# Patient Record
Sex: Male | Born: 1995 | Race: Black or African American | Hispanic: No | Marital: Single | Smoking: Never smoker
Health system: Southern US, Community
[De-identification: ages and names within clinical notes are randomized; demographics above are authoritative.]

## PROBLEM LIST (undated history)

## (undated) DIAGNOSIS — T7840XA Allergy, unspecified, initial encounter: Secondary | ICD-10-CM

## (undated) DIAGNOSIS — F988 Other specified behavioral and emotional disorders with onset usually occurring in childhood and adolescence: Secondary | ICD-10-CM

## (undated) DIAGNOSIS — L309 Dermatitis, unspecified: Secondary | ICD-10-CM

## (undated) DIAGNOSIS — L709 Acne, unspecified: Secondary | ICD-10-CM

## (undated) DIAGNOSIS — J45909 Unspecified asthma, uncomplicated: Secondary | ICD-10-CM

## (undated) DIAGNOSIS — F819 Developmental disorder of scholastic skills, unspecified: Secondary | ICD-10-CM

## (undated) HISTORY — DX: Allergy, unspecified, initial encounter: T78.40XA

## (undated) HISTORY — DX: Other specified behavioral and emotional disorders with onset usually occurring in childhood and adolescence: F98.8

## (undated) HISTORY — DX: Developmental disorder of scholastic skills, unspecified: F81.9

## (undated) HISTORY — DX: Unspecified asthma, uncomplicated: J45.909

## (undated) HISTORY — DX: Acne, unspecified: L70.9

## (undated) HISTORY — DX: Dermatitis, unspecified: L30.9

---
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
## 2016-01-25 ENCOUNTER — Encounter: Payer: Self-pay | Admitting: Family Medicine

## 2016-01-25 ENCOUNTER — Ambulatory Visit (INDEPENDENT_AMBULATORY_CARE_PROVIDER_SITE_OTHER): Payer: Medicaid Other | Admitting: Family Medicine

## 2016-01-25 VITALS — BP 108/72 | HR 60 | Temp 98.6°F | Resp 15 | Ht 67.0 in | Wt 139.5 lb

## 2016-01-25 DIAGNOSIS — Z23 Encounter for immunization: Secondary | ICD-10-CM | POA: Diagnosis not present

## 2016-01-25 DIAGNOSIS — J302 Other seasonal allergic rhinitis: Secondary | ICD-10-CM | POA: Diagnosis not present

## 2016-01-25 DIAGNOSIS — F901 Attention-deficit hyperactivity disorder, predominantly hyperactive type: Secondary | ICD-10-CM

## 2016-01-25 DIAGNOSIS — J4520 Mild intermittent asthma, uncomplicated: Secondary | ICD-10-CM | POA: Diagnosis not present

## 2016-01-25 MED ORDER — LISDEXAMFETAMINE DIMESYLATE 30 MG PO CAPS: 30.0000 mg | ORAL_CAPSULE | Freq: Every day | ORAL | 0 refills | Status: DC

## 2016-01-25 NOTE — Progress Notes (Signed)
Name: SHAH Pitts   MRN: [id number]    DOB: 1995/11/27   Date:01/25/2016       Progress Note  Subjective  Chief Complaint  Chief Complaint  Patient presents with  . Follow-up    2 mo  . 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 He took Vyvanse 30 mg daily for the month of March/April 2017 - his grades improved, he just started [employer] [employer] a few weeks ago, he has been taking Vyvanse and is helping his focus, no grades available yet, he is not feeling overwhelmed with school work. He denies side effects of medication, except for mild lack of appetite at lunch.   AR: he states he is doing well at this, he states symptoms are worse in Spring, currently he has mild sneezing and mild rhinorrhea and nasal congestion with the change in the weather. He has been using nasal steroid and it is helping him  Asthma: he states he has been doing well, no recent episodes of wheezing, cough or SOB, he has not used his rescue inhaler since last Spring. Usually triggered by activity or URI  Patient Active Problem List   Diagnosis Date Noted  . Eczema 02/23/2015  . ADHD (attention deficit hyperactivity disorder) 02/23/2015  . Allergic rhinitis, seasonal 02/23/2015  . Asthma, mild intermittent 02/23/2015  . Learning problem 02/23/2015    History reviewed. No pertinent surgical history.  Family History  Problem Relation Age of Onset  . Asthma Sister   . Seizures Sister     Social History   Social History  . Marital status: Single    Spouse name: N/A  . Number of children: N/A  . Years of education: N/A   Occupational History  . Not on file.   Social History Main Topics  . Smoking status: Never Smoker  . Smokeless tobacco: Never Used  . Alcohol use No  . Drug use: No  . Sexual activity: No   Other Topics Concern  . Not on file   Social History Narrative  . No narrative on file     Current  Outpatient Prescriptions:  .  albuterol (PROVENTIL HFA;VENTOLIN HFA) 108 (90 Base) MCG/ACT inhaler, Inhale 2 puffs into the lungs every 6 (six) hours as needed for wheezing or shortness of breath., Disp: 1 Inhaler, Rfl: 0 .  lisdexamfetamine (VYVANSE) 30 MG capsule, Take 1 capsule (30 mg total) by mouth daily., Disp: 30 capsule, Rfl: 0 .  loratadine (CLARITIN) 10 MG tablet, Take 1 tablet (10 mg total) by mouth daily., Disp: 30 tablet, Rfl: 2 .  lisdexamfetamine (VYVANSE) 30 MG capsule, Take 1 capsule (30 mg total) by mouth daily., Disp: 30 capsule, Rfl: 0 .  lisdexamfetamine (VYVANSE) 30 MG capsule, Take 1 capsule (30 mg total) by mouth daily., Disp: 30 capsule, Rfl: 0  No Known Allergies   ROS  Constitutional: Negative for fever or weight change.  Respiratory: Negative for cough and shortness of breath.   Cardiovascular: Negative for chest pain or palpitations.  Gastrointestinal: Negative for abdominal pain, no bowel changes.  Musculoskeletal: Negative for gait problem or joint swelling.  Skin: Negative for rash.  Neurological: Negative for dizziness or headache.  No other specific complaints in a complete review of systems (except as listed in HPI above).  Objective  Vitals:   01/25/16 0801  BP: 108/72  Pulse: 60  Resp: 15  Temp:  98.6 F (37 C)  TempSrc: Oral  SpO2: 97%  Weight: 139 lb 8 oz (63.3 kg)  Height: 5\' 7"  (1.702 m)    Body mass index is 21.85 kg/m.  Physical Exam  Constitutional: Patient appears well-developed and well-nourished. No distress.  HEENT: head atraumatic, normocephalic, pupils equal and reactive to light, boggy turbinates, neck supple, throat within normal limits Cardiovascular: Normal rate, regular rhythm and normal heart sounds.  No murmur heard. No BLE edema. Pulmonary/Chest: Effort normal and breath sounds normal. No respiratory distress. Abdominal: Soft.  There is no tenderness. Psychiatric: Patient has a normal mood and affect. behavior is  normal. Judgment and thought content normal.  PHQ2/9: [facility] screen [facility] 2/9 01/25/2016 11/24/2015 07/22/2015 02/23/2015  Decreased Interest 0 0 0 0  Down, Depressed, Hopeless 1 0 1 0  PHQ - 2 Score 1 0 1 0    Fall Risk: Fall Risk  01/25/2016 11/24/2015 07/22/2015 02/23/2015  Falls in the past year? No No No No     Functional Status Survey: Is the patient deaf or have difficulty hearing?: No Does the patient have difficulty seeing, even when wearing glasses/contacts?: No Does the patient have difficulty concentrating, remembering, or making decisions?: No Does the patient have difficulty walking or climbing stairs?: No Does the patient have difficulty dressing or bathing?: No Does the patient have difficulty doing errands alone such as visiting a doctor's office or shopping?: No    Assessment & Plan  1. Asthma, mild intermittent, uncomplicated  Doing well at this time  2. Allergic rhinitis, seasonal  Continue medications prn   3. Attention-deficit hyperactivity disorder, predominantly hyperactive type  - lisdexamfetamine (VYVANSE) 30 MG capsule; Take 1 capsule (30 mg total) by mouth daily.  Dispense: 30 capsule; Refill: 0 - lisdexamfetamine (VYVANSE) 30 MG capsule; Take 1 capsule (30 mg total) by mouth daily.  Dispense: 30 capsule; Refill: 0 - lisdexamfetamine (VYVANSE) 30 MG capsule; Take 1 capsule (30 mg total) by mouth daily.  Dispense: 30 capsule; Refill: 0  4. Needs flu shot  - Flu Vaccine QUAD 36+ mos IM

## 2016-03-27 ENCOUNTER — Other Ambulatory Visit: Payer: Self-pay | Admitting: Emergency Medicine

## 2016-03-27 DIAGNOSIS — Z111 Encounter for screening for respiratory tuberculosis: Secondary | ICD-10-CM

## 2016-03-29 LAB — QUANTIFERON TB GOLD ASSAY (BLOOD)
INTERFERON GAMMA RELEASE ASSAY: NEGATIVE
MITOGEN-NIL SO: 4.09 [IU]/mL
QUANTIFERON NIL VALUE: 0.06 [IU]/mL
Quantiferon Tb Ag Minus Nil Value: 0.16 IU/mL

## 2016-05-30 ENCOUNTER — Ambulatory Visit: Payer: Medicaid Other | Admitting: Family Medicine

## 2016-06-23 ENCOUNTER — Ambulatory Visit (INDEPENDENT_AMBULATORY_CARE_PROVIDER_SITE_OTHER): Payer: Medicaid Other | Admitting: Family Medicine

## 2016-06-23 ENCOUNTER — Encounter: Payer: Self-pay | Admitting: Family Medicine

## 2016-06-23 VITALS — BP 100/68 | HR 64 | Temp 98.2°F | Resp 16 | Ht 67.0 in | Wt 136.4 lb

## 2016-06-23 DIAGNOSIS — J302 Other seasonal allergic rhinitis: Secondary | ICD-10-CM

## 2016-06-23 DIAGNOSIS — F901 Attention-deficit hyperactivity disorder, predominantly hyperactive type: Secondary | ICD-10-CM

## 2016-06-23 DIAGNOSIS — J4520 Mild intermittent asthma, uncomplicated: Secondary | ICD-10-CM | POA: Diagnosis not present

## 2016-06-23 DIAGNOSIS — G4489 Other headache syndrome: Secondary | ICD-10-CM | POA: Diagnosis not present

## 2016-06-23 DIAGNOSIS — G4720 Circadian rhythm sleep disorder, unspecified type: Secondary | ICD-10-CM

## 2016-06-23 MED ORDER — LORATADINE 10 MG PO TABS: 10.0000 mg | ORAL_TABLET | Freq: Every day | ORAL | 2 refills | Status: DC

## 2016-06-23 MED ORDER — LISDEXAMFETAMINE DIMESYLATE 30 MG PO CAPS: 30.0000 mg | ORAL_CAPSULE | Freq: Every day | ORAL | 0 refills | Status: DC

## 2016-06-23 MED ORDER — ALBUTEROL SULFATE HFA 108 (90 BASE) MCG/ACT IN AERS: 2.0000 | INHALATION_SPRAY | Freq: Four times a day (QID) | RESPIRATORY_TRACT | 0 refills | Status: DC | PRN

## 2016-06-23 NOTE — Progress Notes (Addendum)
Name: Benjamin Pitts   MRN: [id number]    DOB: 24-Apr-1996   Date:06/23/2016       Progress Note  Subjective  Chief Complaint  Chief Complaint  Patient presents with  . Asthma    4 month follow up  . 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 He took Vyvanse 30 mg daily for the month of March/April 2017 - his grades improved, [facility] [employer] [facility]  he has been taking Vyvanse and is helping him focusHe denies side effects of medication, except for mild lack of appetite at lunch.   AR: he states he is doing well at this, he states symptoms are worse in Spring, currently he has mild sneezing and mild rhinorrhea and nasal congestion with the change in the weather. He has been using nasal steroid and it is helping him  Asthma: he states he has been doing well, no recent episodes of wheezing, cough or SOB, he has not used rescue inhaler recently.   Headache: he states episodes started a couple months ago, it is getting more frequent, he states not present on weekends. Usually during the week when classes are filled with students. Headache is described as dull and sometimes sharp, and resolves when class is over. He has been taking aspirin and tylenol prn. He is taking medication, and explained he needs to stop taking it because it may cause rebound headaches. Advised to sit in the front of the class. Discussed follow up with therapist, since it seems to be related to anxiety  Sleep problems: taking naps and has difficulty staying asleep at night.    Patient Active Problem List   Diagnosis Date Noted  . Eczema 02/23/2015  . ADHD (attention deficit hyperactivity disorder) 02/23/2015  . Allergic rhinitis, seasonal 02/23/2015  . Asthma, mild intermittent 02/23/2015  . Learning problem 02/23/2015    History reviewed. No pertinent surgical history.  Family History  Problem Relation Age of Onset  . Asthma Sister    . Seizures Sister     Social History   Social History  . Marital status: Single    Spouse name: N/A  . Number of children: N/A  . Years of education: N/A   Occupational History  . Not on file.   Social History Main Topics  . Smoking status: Never Smoker  . Smokeless tobacco: Never Used  . Alcohol use No  . Drug use: No  . Sexual activity: No   Other Topics Concern  . Not on file   Social History Narrative   Graduated HS Spring 2017   [facility] [employer] [facility]. 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  albuterol (PROVENTIL HFA;VENTOLIN HFA) 108 (90 Base) MCG/ACT inhaler, Inhale 2 puffs into the lungs every 6 (six) hours as needed for wheezing or shortness of breath., Disp: 1 Inhaler, Rfl: 0 .  lisdexamfetamine (VYVANSE) 30 MG capsule, Take 1 capsule (30 mg total) by mouth daily., Disp: 30 capsule, Rfl: 0 .  lisdexamfetamine (VYVANSE) 30 MG capsule, Take 1 capsule (30 mg total) by mouth daily., Disp: 30 capsule, Rfl: 0 .  lisdexamfetamine (VYVANSE) 30 MG capsule, Take 1 capsule (30 mg total) by mouth daily., Disp: 30 capsule, Rfl: 0 .  loratadine (CLARITIN) 10 MG tablet, Take 1 tablet (10 mg total) by mouth daily., Disp: 30 tablet, Rfl: 2  No  Known Allergies   ROS  Constitutional: Negative for fever or weight change.  Respiratory: Negative for cough and shortness of breath.   Cardiovascular: Negative for chest pain or palpitations.  Gastrointestinal: Negative for abdominal pain, no bowel changes.  Musculoskeletal: Negative for gait problem or joint swelling.  Skin: Negative for rash.  Neurological: Negative for dizziness , positive for headache.  No other specific complaints in a complete review of systems (except as listed in HPI above).  Objective  Vitals:   06/23/16 0937  BP: 100/68  Pulse: 64  Resp: 16  Temp: 98.2 F (36.8 C)  SpO2: 98%  Weight: 136 lb 7 oz (61.9 kg)  Height: 5\' 7"  (1.702 m)    Body mass index is  21.37 kg/m.  Physical Exam  Constitutional: Patient appears well-developed and well-nourished. No distress.  HEENT: head atraumatic, normocephalic, pupils equal and reactive to light,  neck supple, throat within normal limits Cardiovascular: Normal rate, regular rhythm and normal heart sounds.  No murmur heard. No BLE edema. Pulmonary/Chest: Effort normal and breath sounds normal. No respiratory distress. Abdominal: Soft.  There is no tenderness. Psychiatric: Patient has a normal mood and affect. behavior is normal. Judgment and thought content normal.  Recent Results (from the past 2160 hour(s))  Quantiferon tb gold assay     Status: None   Collection Time: 03/27/16  4:13 PM  Result Value Ref Range   Interferon Gamma Release Assay NEGATIVE NEGATIVE    Comment: Negative test result. M. tuberculosis complex infection unlikely.   Quantiferon Nil Value 0.06 IU/mL   Mitogen-Nil 4.09 IU/mL   Quantiferon Tb Ag Minus Nil Value 0.16 IU/mL    Comment:   The Nil tube value is used to determine if the patient has a preexisting immune response which could cause a false-positive reading on the test. In order for a test to be valid, the Nil tube must have a value of less than or equal to 8.0 IU/mL.   The mitogen control tube is used to assure the patient has a healthy immune status and also serves as a control for correct blood handling and incubation. It is used to detect false-negative readings. The mitogen tube must have a gamma interferon value of greater than or equal to 0.5 IU/mL higher than the value of the Nil tube.   The TB antigen tube is coated with the M. tuberculosis specific antigens. For a test to be considered positive, the TB antigen tube value minus the Nil tube value must be greater than or equal to 0.35 IU/mL.   For additional information, please refer to http://education.questdiagn[facility].com/faq/QFT (This link is being provided for informational/educational purposes  only.)       PHQ2/9: Depression screen [facility] 2/9 01/25/2016 11/24/2015 07/22/2015 02/23/2015  Decreased Interest 0 0 0 0  Down, Depressed, Hopeless 1 0 1 0  PHQ - 2 Score 1 0 1 0     Fall Risk: Fall Risk  01/25/2016 11/24/2015 07/22/2015 02/23/2015  Falls in the past year? No No No No     Assessment & Plan  1. Attention-deficit hyperactivity disorder, predominantly hyperactive type  - lisdexamfetamine (VYVANSE) 30 MG capsule; Take 1 capsule (30 mg total) by mouth daily.  Dispense: 30 capsule; Refill: 0 - lisdexamfetamine (VYVANSE) 30 MG capsule; Take 1 capsule (30 mg total) by mouth daily.  Dispense: 30 capsule; Refill: 0 - lisdexamfetamine (VYVANSE) 30 MG capsule; Take 1 capsule (30 mg total) by mouth daily.  Dispense: 30 capsule; Refill: 0  2.  Mild intermittent asthma without complication  - albuterol (PROVENTIL HFA;VENTOLIN HFA) 108 (90 Base) MCG/ACT inhaler; Inhale 2 puffs into the lungs every 6 (six) hours as needed for wheezing or shortness of breath.  Dispense: 1 Inhaler; Refill: 0  3. Seasonal allergic rhinitis, unspecified chronicity, unspecified trigger  - loratadine (CLARITIN) 10 MG tablet; Take 1 tablet (10 mg total) by mouth daily.  Dispense: 30 tablet; Refill: 2  4. Circadian dysregulation  Discussed sleep hygiene , stop naps in the pm, wake up at the same time every morning  5. Other headache syndrome  Likely from anxiety, going to see therapist today, advised to try sitting in the front of the class

## 2016-09-20 ENCOUNTER — Ambulatory Visit: Payer: Medicaid Other | Admitting: Family Medicine

## 2016-11-28 ENCOUNTER — Encounter: Payer: Self-pay | Admitting: Family Medicine

## 2016-11-28 ENCOUNTER — Ambulatory Visit (INDEPENDENT_AMBULATORY_CARE_PROVIDER_SITE_OTHER): Payer: Medicaid Other | Admitting: Family Medicine

## 2016-11-28 VITALS — BP 108/68 | HR 76 | Temp 98.2°F | Resp 16 | Ht 67.0 in | Wt 136.0 lb

## 2016-11-28 DIAGNOSIS — J4540 Moderate persistent asthma, uncomplicated: Secondary | ICD-10-CM

## 2016-11-28 DIAGNOSIS — J302 Other seasonal allergic rhinitis: Secondary | ICD-10-CM | POA: Diagnosis not present

## 2016-11-28 DIAGNOSIS — F901 Attention-deficit hyperactivity disorder, predominantly hyperactive type: Secondary | ICD-10-CM | POA: Diagnosis not present

## 2016-11-28 MED ORDER — LISDEXAMFETAMINE DIMESYLATE 30 MG PO CAPS: 30.0000 mg | ORAL_CAPSULE | Freq: Every day | ORAL | 0 refills | Status: DC

## 2016-11-28 MED ORDER — BECLOMETHASONE DIPROPIONATE 40 MCG/ACT IN AERS: 2.0000 | INHALATION_SPRAY | Freq: Two times a day (BID) | RESPIRATORY_TRACT | 2 refills | Status: DC

## 2016-11-28 NOTE — Addendum Note (Signed)
Addended by: Cynda FamiliaJOHNSON, Chasyn Cinque L on: 11/28/2016 03:46 PM   Modules accepted: Orders

## 2016-11-28 NOTE — Progress Notes (Signed)
Name: Benjamin Pitts   MRN: [id number]    DOB: 1995/08/26   Date:11/28/2016       Progress Note  Subjective  Chief Complaint  Chief Complaint  Patient presents with  . Medication Refill    4 month F/U  . Asthma    Well controlled  . ADHD    Needs refill-for working and going to school  . Allergic Rhinitis     Nose Stops up occasionally    HPI  ADHD with learning disability: he states He [facility] [employer] 2017  he has been tout of Vyvanse and is back to school, he has noticed difficulty with focus, and staying organized. He usually skips medication, takes mostly for test days, but advised to take it daily while in college. No side effects  AR: he still has episodes of nasal congestion and sneezing.   Asthma: he had a few episodes of wheezing and cough over the past couple of months, usually triggered by exposure to dust or exercise, and seems to be worse at night, spirometry was not normal, not sure if effort based, but because symptoms more often we will add Qvar   Patient Active Problem List   Diagnosis Date Noted  . Eczema 02/23/2015  . ADHD (attention deficit hyperactivity disorder) 02/23/2015  . Allergic rhinitis, seasonal 02/23/2015  . Asthma, mild intermittent 02/23/2015  . Learning problem 02/23/2015    History reviewed. No pertinent surgical history.  Family History  Problem Relation Age of Onset  . Asthma Sister   . Seizures Sister     Social History   Social History  . Marital status: Single    Spouse name: N/A  . Number of children: N/A  . Years of education: N/A   Occupational History  . Not on file.   Social History Main Topics  . Smoking status: Never Smoker  . Smokeless tobacco: Never Used  . Alcohol use No  . Drug use: No  . Sexual activity: No   Other Topics Concern  . Not on file   Social History Narrative   Graduated HS Spring 2017   Going [facility] [employer] with scholarship.   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  albuterol (PROVENTIL HFA;VENTOLIN HFA) 108 (90 Base) MCG/ACT inhaler, Inhale 2 puffs into the lungs every 6 (six) hours as needed for wheezing or shortness of breath., Disp: 1 Inhaler, Rfl: 0 .  lisdexamfetamine (VYVANSE) 30 MG capsule, Take 1 capsule (30 mg total) by mouth daily., Disp: 30 capsule, Rfl: 0 .  lisdexamfetamine (VYVANSE) 30 MG capsule, Take 1 capsule (30 mg total) by mouth daily., Disp: 30 capsule, Rfl: 0 .  lisdexamfetamine (VYVANSE) 30 MG capsule, Take 1 capsule (30 mg total) by mouth daily., Disp: 30 capsule, Rfl: 0 .  loratadine (CLARITIN) 10 MG tablet, Take 1 tablet (10 mg total) by mouth daily., Disp: 30 tablet, Rfl: 2 .  beclomethasone (QVAR) 40 MCG/ACT inhaler, Inhale 2 puffs into the lungs 2 (two) times daily., Disp: 1 Inhaler, Rfl: 2  No Known Allergies   ROS  Constitutional: Negative for fever or weight change.  Respiratory: Positive for cough and shortness of breath.   Cardiovascular: Negative for chest pain or palpitations.  Gastrointestinal: Negative for abdominal pain, no bowel changes.  Musculoskeletal: Negative for gait problem or joint swelling.  Skin: Negative for rash.  Neurological: Negative for dizziness or headache.  No other specific complaints in a complete review of systems (except as listed in HPI above).  Objective  Vitals:   11/28/16 1521  BP: 108/68  Pulse: 76  Resp: 16  Temp: 98.2 F (36.8 C)  TempSrc: Oral  SpO2: 96%  Weight: 136 lb (61.7 kg)  Height: 5\' 7"  (1.702 m)    Body mass index is 21.3 kg/m.  Physical Exam  Constitutional: Patient appears well-developed and well-nourished. No distress.  HEENT: head atraumatic, normocephalic, pupils equal and reactive to light, neck supple, throat within normal limits Cardiovascular: Normal rate, regular rhythm and normal heart sounds.  No murmur heard. No BLE edema. Pulmonary/Chest: Effort normal and breath sounds normal. No  respiratory distress. Abdominal: Soft.  There is no tenderness. Psychiatric: Patient has a normal mood and affect. behavior is normal. Judgment and thought content normal.  PHQ2/9: Depression screen [facility] 2/9 11/28/2016 01/25/2016 11/24/2015 07/22/2015 02/23/2015  Decreased Interest 0 0 0 0 0  Down, Depressed, Hopeless 0 1 0 1 0  PHQ - 2 Score 0 1 0 1 0     Fall Risk: Fall Risk  11/28/2016 01/25/2016 11/24/2015 07/22/2015 02/23/2015  Falls in the past year? No No No No No     Functional Status Survey: Is the patient deaf or have difficulty hearing?: No Does the patient have difficulty seeing, even when wearing glasses/contacts?: No Does the patient have difficulty concentrating, remembering, or making decisions?: No Does the patient have difficulty walking or climbing stairs?: No Does the patient have difficulty dressing or bathing?: No Does the patient have difficulty doing errands alone such as visiting a doctor's office or shopping?: No    Assessment & Plan  1. Moderate persistent asthma without complication  - beclomethasone (QVAR) 40 MCG/ACT inhaler; Inhale 2 puffs into the lungs 2 (two) times daily.  Dispense: 1 Inhaler; Refill: 2  - spirometry   2. Attention-deficit hyperactivity disorder, predominantly hyperactive type  - lisdexamfetamine (VYVANSE) 30 MG capsule; Take 1 capsule (30 mg total) by mouth daily.  Dispense: 30 capsule; Refill: 0 - lisdexamfetamine (VYVANSE) 30 MG capsule; Take 1 capsule (30 mg total) by mouth daily.  Dispense: 30 capsule; Refill: 0 - lisdexamfetamine (VYVANSE) 30 MG capsule; Take 1 capsule (30 mg total) by mouth daily.  Dispense: 30 capsule; Refill: 0  3. Seasonal allergic rhinitis, unspecified trigger  Continue medication

## 2017-03-02 ENCOUNTER — Encounter: Payer: Self-pay | Admitting: Family Medicine

## 2017-03-02 ENCOUNTER — Ambulatory Visit (INDEPENDENT_AMBULATORY_CARE_PROVIDER_SITE_OTHER): Payer: Medicaid Other | Admitting: Family Medicine

## 2017-03-02 VITALS — BP 100/50 | HR 63 | Resp 12 | Ht 67.0 in | Wt 136.5 lb

## 2017-03-02 DIAGNOSIS — J302 Other seasonal allergic rhinitis: Secondary | ICD-10-CM

## 2017-03-02 DIAGNOSIS — Z23 Encounter for immunization: Secondary | ICD-10-CM | POA: Diagnosis not present

## 2017-03-02 DIAGNOSIS — J4540 Moderate persistent asthma, uncomplicated: Secondary | ICD-10-CM

## 2017-03-02 DIAGNOSIS — F901 Attention-deficit hyperactivity disorder, predominantly hyperactive type: Secondary | ICD-10-CM | POA: Diagnosis not present

## 2017-03-02 MED ORDER — LISDEXAMFETAMINE DIMESYLATE 30 MG PO CAPS: 30.0000 mg | ORAL_CAPSULE | Freq: Every day | ORAL | 0 refills | Status: DC

## 2017-03-02 MED ORDER — ALBUTEROL SULFATE HFA 108 (90 BASE) MCG/ACT IN AERS: 2.0000 | INHALATION_SPRAY | Freq: Four times a day (QID) | RESPIRATORY_TRACT | 0 refills | Status: DC | PRN

## 2017-03-02 MED ORDER — LORATADINE 10 MG PO TABS: 10.0000 mg | ORAL_TABLET | Freq: Every day | ORAL | 2 refills | Status: DC

## 2017-03-02 MED ORDER — BECLOMETHASONE DIPROPIONATE 40 MCG/ACT IN AERS: 2.0000 | INHALATION_SPRAY | Freq: Two times a day (BID) | RESPIRATORY_TRACT | 2 refills | Status: DC

## 2017-03-02 NOTE — Progress Notes (Signed)
Name: Benjamin Pitts   MRN: [id number]    DOB: 12/02/1995   Date:03/02/2017       Progress Note  Subjective  Chief Complaint  Chief Complaint  Patient presents with  . Asthma    HPI   ADHD with learning disability: he states He [facility] [employer] [facility] [facility]. 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   AR: he still has episodes of nasal congestion and sneezing but occasionally.   Asthma: he states he is doing better, no recent episodes of wheezing or cough, denies chest pain. He is still not using Qvar daily. Uses rescue inhaler when physically active.   Patient Active Problem List   Diagnosis Date Noted  . Eczema 02/23/2015  . ADHD (attention deficit hyperactivity disorder) 02/23/2015  . Allergic rhinitis, seasonal 02/23/2015  . Asthma, mild intermittent 02/23/2015  . Learning problem 02/23/2015    No past surgical history on file.  Family History  Problem Relation Age of Onset  . Asthma Sister   . Seizures Sister     Social History   Social History  . Marital status: Single    Spouse name: N/A  . Number of children: N/A  . Years of education: N/A   Occupational History  . Not on file.   Social History Main Topics  . Smoking status: Never Smoker  . Smokeless tobacco: Never Used  . Alcohol use No  . Drug use: No  . Sexual activity: No   Other Topics Concern  . Not on file   Social History Narrative   Graduated HS Spring 2017   [facility] [employer] [facility].   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  lisdexamfetamine (VYVANSE) 30 MG capsule, Take 1 capsule (30 mg total) by mouth daily., Disp: 30 capsule, Rfl: 0 .  lisdexamfetamine (VYVANSE) 30 MG capsule, Take 1 capsule (30 mg total) by  mouth daily., Disp: 30 capsule, Rfl: 0 .  lisdexamfetamine (VYVANSE) 30 MG capsule, Take 1 capsule (30 mg total) by mouth daily., Disp: 30 capsule, Rfl: 0 .  albuterol (PROVENTIL HFA;VENTOLIN HFA) 108 (90 Base) MCG/ACT inhaler, Inhale 2 puffs into the lungs every 6 (six) hours as needed for wheezing or shortness of breath., Disp: 1 Inhaler, Rfl: 0 .  beclomethasone (QVAR) 40 MCG/ACT inhaler, Inhale 2 puffs into the lungs 2 (two) times daily., Disp: 1 Inhaler, Rfl: 2 .  loratadine (CLARITIN) 10 MG tablet, Take 1 tablet (10 mg total) by mouth daily., Disp: 30 tablet, Rfl: 2  No Known Allergies   ROS  Constitutional: Negative for fever or weight change.  Respiratory: Negative for cough , occasional shortness of breath.   Cardiovascular: Negative for chest pain or palpitations.  Gastrointestinal: Negative for abdominal pain, no bowel changes.  Musculoskeletal: Negative for gait problem or joint swelling.  Skin: Negative for rash.  Neurological: Negative for dizziness or headache.  No other specific complaints in a complete review of systems (except as listed in HPI above).  Objective  Vitals:   03/02/17 1442  BP: (!) 100/50  Pulse: 63  Resp: 12  SpO2: 98%  Weight: 136 lb 8 oz (61.9 kg)  Height: 5\' 7"  (  1.702 m)    Body mass index is 21.38 kg/m.  Physical Exam  Constitutional: Patient appears well-developed and well-nourished. No distress.  HEENT: head atraumatic, normocephalic, pupils equal and reactive to light, neck supple, throat within normal limits Cardiovascular: Normal rate, regular rhythm and normal heart sounds.  No murmur heard. No BLE edema. Pulmonary/Chest: Effort normal and breath sounds normal. No respiratory distress. Abdominal: Soft.  There is no tenderness. Psychiatric: Patient has a normal mood and affect. behavior is normal. Judgment and thought content normal.  PHQ2/9: Depression screen [facility] 2/9 11/28/2016 01/25/2016 11/24/2015 07/22/2015 02/23/2015  Decreased  Interest 0 0 0 0 0  Down, Depressed, Hopeless 0 1 0 1 0  PHQ - 2 Score 0 1 0 1 0     Fall Risk: Fall Risk  11/28/2016 01/25/2016 11/24/2015 07/22/2015 02/23/2015  Falls in the past year? No No No No No     Assessment & Plan  1. Moderate persistent asthma without complication  - albuterol (PROVENTIL HFA;VENTOLIN HFA) 108 (90 Base) MCG/ACT inhaler; Inhale 2 puffs into the lungs every 6 (six) hours as needed for wheezing or shortness of breath.  Dispense: 1 Inhaler; Refill: 0 - beclomethasone (QVAR) 40 MCG/ACT inhaler; Inhale 2 puffs into the lungs 2 (two) times daily.  Dispense: 1 Inhaler; Refill: 2  2. Attention-deficit hyperactivity disorder, predominantly hyperactive type  - lisdexamfetamine (VYVANSE) 30 MG capsule; Take 1 capsule (30 mg total) by mouth daily.  Dispense: 30 capsule; Refill: 0 - lisdexamfetamine (VYVANSE) 30 MG capsule; Take 1 capsule (30 mg total) by mouth daily.  Dispense: 30 capsule; Refill: 0 - lisdexamfetamine (VYVANSE) 30 MG capsule; Take 1 capsule (30 mg total) by mouth daily.  Dispense: 30 capsule; Refill: 0  Discussed controlled medication, cannot sale, diverge, and cannot take any illegal drugs, he agrees on having urine screen shot as needed.   3. Seasonal allergic rhinitis, unspecified trigger  - loratadine (CLARITIN) 10 MG tablet; Take 1 tablet (10 mg total) by mouth daily.  Dispense: 30 tablet; Refill: 2  4. Needs flu shot  - Flu Vaccine QUAD 36+ mos IM

## 2017-05-30 ENCOUNTER — Encounter: Payer: Self-pay | Admitting: Family Medicine

## 2017-05-30 ENCOUNTER — Ambulatory Visit (INDEPENDENT_AMBULATORY_CARE_PROVIDER_SITE_OTHER): Payer: Medicaid Other | Admitting: Family Medicine

## 2017-05-30 VITALS — BP 100/70 | HR 65 | Temp 98.3°F | Resp 18 | Ht 67.0 in | Wt 137.2 lb

## 2017-05-30 DIAGNOSIS — S83412A Sprain of medial collateral ligament of left knee, initial encounter: Secondary | ICD-10-CM | POA: Diagnosis not present

## 2017-05-30 DIAGNOSIS — S8992XA Unspecified injury of left lower leg, initial encounter: Secondary | ICD-10-CM | POA: Diagnosis not present

## 2017-05-30 NOTE — Progress Notes (Signed)
Name: Benjamin Pitts   MRN: [id number]    DOB: [birth year]   Date:05/30/2017       Progress Note  Subjective  Chief Complaint  Chief Complaint  Patient presents with  . Knee Pain    left knee pain for 4 days    HPI  Pt presents with 4 day history of LEFT knee pain after playing basketball with his cousins.   He went to block someone from going around him and his knee inverted and he fell.  He has been wearing a knee brace to try to help with the pain - has helped a little bit.  He works at [employer] and he was able to do some lighter duties, and did well with this; he has the next couple of days off to rest.  Denies swelling, bruising, leg has not given out on him; no numbness or tingling.  Patient Active Problem List   Diagnosis Date Noted  . Eczema 02/23/2015  . ADHD (attention deficit hyperactivity disorder) 02/23/2015  . Allergic rhinitis, seasonal 02/23/2015  . Asthma, mild intermittent 02/23/2015  . Learning problem 02/23/2015    Social History   Tobacco Use  . Smoking status: Never Smoker  . Smokeless tobacco: Never Used  Substance Use Topics  . Alcohol use: No    Alcohol/week: 0.0 oz     Current Outpatient Medications:  .  albuterol (PROVENTIL HFA;VENTOLIN HFA) 108 (90 Base) MCG/ACT inhaler, Inhale 2 puffs into the lungs every 6 (six) hours as needed for wheezing or shortness of breath., Disp: 1 Inhaler, Rfl: 0 .  beclomethasone (QVAR) 40 MCG/ACT inhaler, Inhale 2 puffs into the lungs 2 (two) times daily., Disp: 1 Inhaler, Rfl: 2 .  lisdexamfetamine (VYVANSE) 30 MG capsule, Take 1 capsule (30 mg total) by mouth daily., Disp: 30 capsule, Rfl: 0 .  lisdexamfetamine (VYVANSE) 30 MG capsule, Take 1 capsule (30 mg total) by mouth daily., Disp: 30 capsule, Rfl: 0 .  lisdexamfetamine (VYVANSE) 30 MG capsule, Take 1 capsule (30 mg total) by mouth daily., Disp: 30 capsule, Rfl: 0 .  loratadine (CLARITIN) 10 MG tablet, Take 1 tablet (10 mg total) by mouth daily.,  Disp: 30 tablet, Rfl: 2  No Known Allergies  ROS  Ten systems reviewed and is negative except as mentioned in HPI  Objective  Vitals:   05/30/17 0844  BP: 100/70  Pulse: 65  Resp: 18  Temp: 98.3 F (36.8 C)  TempSrc: Oral  SpO2: 99%  Weight: 137 lb 3.2 oz (62.2 kg)  Height: 5\' 7"  (1.702 m)   Body mass index is 21.49 kg/m.  Nursing Note and Vital Signs reviewed.  Physical Exam Constitutional: Patient appears well-developed and well-nourished. No distress.  HEENT: head atraumatic, normocephalic Cardiovascular: Normal rate, regular rhythm, S1/S2 present.  No murmur or rub heard. No BLE edema. Pulmonary/Chest: Effort normal and breath sounds clear. No respiratory distress or retractions. Psychiatric: Patient has a normal mood and affect. behavior is normal. Judgment and thought content normal. Musculoskeletal: No joint swelling.  LEFT knee exhibits tenderness to the medial knee, limited flexion, and pain is elicited with inversion and eversion against resistance.  Strength is also decreased at the joint.  Normal popliteal fossa with +2 pulse, no ecchymosis. Skin: Skin is warm and dry. No rash noted. No erythema.   No results found for this or any previous visit (from the past 72 hour(s)).  Assessment & Plan  1. Left knee injury, initial encounter - Ambulatory referral to Orthopedic Surgery -  Appointment at 10am today - pt to go directly there.

## 2017-06-07 ENCOUNTER — Encounter: Payer: Self-pay | Admitting: Family Medicine

## 2017-06-07 ENCOUNTER — Ambulatory Visit (INDEPENDENT_AMBULATORY_CARE_PROVIDER_SITE_OTHER): Payer: Medicaid Other | Admitting: Family Medicine

## 2017-06-07 VITALS — BP 94/60 | HR 56 | Resp 14 | Ht 67.0 in | Wt 140.0 lb

## 2017-06-07 DIAGNOSIS — J4540 Moderate persistent asthma, uncomplicated: Secondary | ICD-10-CM

## 2017-06-07 DIAGNOSIS — S83402D Sprain of unspecified collateral ligament of left knee, subsequent encounter: Secondary | ICD-10-CM

## 2017-06-07 DIAGNOSIS — F901 Attention-deficit hyperactivity disorder, predominantly hyperactive type: Secondary | ICD-10-CM | POA: Diagnosis not present

## 2017-06-07 DIAGNOSIS — J302 Other seasonal allergic rhinitis: Secondary | ICD-10-CM | POA: Diagnosis not present

## 2017-06-07 MED ORDER — LISDEXAMFETAMINE DIMESYLATE 30 MG PO CAPS: 30.0000 mg | ORAL_CAPSULE | Freq: Every day | ORAL | 0 refills | Status: DC

## 2017-06-07 MED ORDER — ALBUTEROL SULFATE HFA 108 (90 BASE) MCG/ACT IN AERS: 2.0000 | INHALATION_SPRAY | Freq: Four times a day (QID) | RESPIRATORY_TRACT | 0 refills | Status: DC | PRN

## 2017-06-07 MED ORDER — BECLOMETHASONE DIPROPIONATE 40 MCG/ACT IN AERS: 2.0000 | INHALATION_SPRAY | Freq: Two times a day (BID) | RESPIRATORY_TRACT | 2 refills | Status: DC

## 2017-06-07 NOTE — Progress Notes (Signed)
Name: Benjamin BolusMonquaille R Pitts   MRN: [id number]    DOB: July 26, 1995   Date:06/07/2017       Progress Note  Subjective  Chief Complaint  Chief Complaint  Patient presents with  . ADD  . Asthma    HPI   ADHD with learning disability: he states He started [employer] 2017. He is not currently in school trying to go back, and is working as a host at [employer], he has a Insurance account managerjob coach and I applying for colleges and is spending a long time doing research and has to take Vyvanse to be able to function and stay focused. No side effects of medication. Denies change in appetite or palpitation    AR: he still has episodes of nasal congestion and sneezing but occasionally.   Asthma: he states he is doing better, no recent episodes of wheezing or cough, denies chest pain. He is now using Qvar daily but told me today he uses Ventolin a couple of times a day, usually when feels overwhelmed at work - explained rescue inhaler is for bronchospasms and not anxiety sensation. Needs to take deep breathes and only use Ventolin prn for asthma  Sprain of left collateral medial ligament : after an injury earlier this month, seen by Dr. Odis LusterBowers, negative x-ray, wearing a brace and also taking Meloxicam, states feeling better, having modified duties at work and church  Patient Active Problem List   Diagnosis Date Noted  . Eczema 02/23/2015  . ADHD (attention deficit hyperactivity disorder) 02/23/2015  . Allergic rhinitis, seasonal 02/23/2015  . Asthma, mild intermittent 02/23/2015  . Learning problem 02/23/2015    History reviewed. No pertinent surgical history.  Family History  Problem Relation Age of Onset  . Asthma Sister   . Seizures Sister     Social History   Socioeconomic History  . Marital status: Single    Spouse name: Not on file  . Number of children: Not on file  . Years of education: Not on file  . Highest education level: Not on file  Social Needs  . Financial resource strain: Not on file  . Food  insecurity - worry: Not on file  . Food insecurity - inability: Not on file  . Transportation needs - medical: Not on file  . Transportation needs - non-medical: Not on file  Occupational History  . Not on file  Tobacco Use  . Smoking status: Never Smoker  . Smokeless tobacco: Never Used  Substance and Sexual Activity  . Alcohol use: No    Alcohol/week: 0.0 oz  . Drug use: No  . Sexual activity: No  Other Topics Concern  . Not on file  Social History Narrative   Graduated HS Spring 2017   Going to [employer] with scholarship.   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  albuterol (PROVENTIL HFA;VENTOLIN HFA) 108 (90 Base) MCG/ACT inhaler, Inhale 2 puffs into the lungs every 6 (six) hours as needed for wheezing or shortness of breath., Disp: 1 Inhaler, Rfl: 0 .  beclomethasone (QVAR) 40 MCG/ACT inhaler, Inhale 2 puffs into the lungs 2 (two) times daily., Disp: 1 Inhaler, Rfl: 2 .  lisdexamfetamine (VYVANSE) 30 MG capsule, Take 1 capsule (30 mg total) by mouth daily., Disp: 30 capsule, Rfl: 0 .  lisdexamfetamine (VYVANSE) 30 MG capsule, Take 1 capsule (30 mg total) by mouth daily., Disp: 30 capsule, Rfl: 0 .  lisdexamfetamine (VYVANSE) 30 MG capsule, Take 1 capsule (30 mg total) by mouth daily., Disp: 30 capsule, Rfl: 0 .  loratadine (CLARITIN) 10 MG tablet, Take 1 tablet (10 mg total) by mouth daily., Disp: 30 tablet, Rfl: 2  No Known Allergies   ROS  Constitutional: Negative for fever or weight change.  Respiratory: Negative for cough and shortness of breath.   Cardiovascular: Negative for chest pain or palpitations.  Gastrointestinal: Negative for abdominal pain, no bowel changes.  Musculoskeletal: Negative for gait problem or joint swelling.  Skin: Negative for rash.  Neurological: Negative for dizziness or headache.  No other specific complaints in a complete review of systems (except as listed in HPI  above).  Objective  Vitals:   06/07/17 0946  BP: 94/60  Pulse: (!) 56  Resp: 14  SpO2: 98%  Weight: 140 lb (63.5 kg)  Height: 5\' 7"  (1.702 m)    Body mass index is 21.93 kg/m.  Physical Exam  Constitutional: Patient appears well-developed and well-nourished. No distress.  HEENT: head atraumatic, normocephalic, pupils equal and reactive to light,neck supple, throat within normal limits Cardiovascular: Normal rate, regular rhythm and normal heart sounds.  No murmur heard. No BLE edema. Pulmonary/Chest: Effort normal and breath sounds normal. No respiratory distress. Abdominal: Soft.  There is no tenderness. Psychiatric: Patient has a normal mood and affect. behavior is normal. Judgment and thought content normal. Muscular Skeletal: not done, wearing a hinge brace left knee   PHQ2/9: Depression screen [facility] 2/9 11/28/2016 01/25/2016 11/24/2015 07/22/2015 02/23/2015  Decreased Interest 0 0 0 0 0  Down, Depressed, Hopeless 0 1 0 1 0  PHQ - 2 Score 0 1 0 1 0     Fall Risk: Fall Risk  06/07/2017 11/28/2016 01/25/2016 11/24/2015 07/22/2015  Falls in the past year? Yes No No No No  Number falls in past yr: 1 - - - -  Injury with Fall? Yes - - - -  Follow up Education provided - - - -     Functional Status Survey: Is the patient deaf or have difficulty hearing?: No Does the patient have difficulty seeing, even when wearing glasses/contacts?: No Does the patient have difficulty concentrating, remembering, or making decisions?: No Does the patient have difficulty walking or climbing stairs?: No Does the patient have difficulty dressing or bathing?: No Does the patient have difficulty doing errands alone such as visiting a doctor's office or shopping?: No    Assessment & Plan  1. Moderate persistent asthma without complication  - beclomethasone (QVAR) 40 MCG/ACT inhaler; Inhale 2 puffs into the lungs 2 (two) times daily.  Dispense: 1 Inhaler; Refill: 2 - albuterol (PROVENTIL  HFA;VENTOLIN HFA) 108 (90 Base) MCG/ACT inhaler; Inhale 2 puffs into the lungs every 6 (six) hours as needed for wheezing or shortness of breath.  Dispense: 1 Inhaler; Refill: 0  2. Attention-deficit hyperactivity disorder, predominantly hyperactive type  - lisdexamfetamine (VYVANSE) 30 MG capsule; Take 1 capsule (30 mg total) by mouth daily.  Dispense: 30 capsule; Refill: 0 - lisdexamfetamine (VYVANSE) 30 MG capsule; Take 1 capsule (30 mg total) by mouth daily.  Dispense: 30 capsule; Refill: 0 - lisdexamfetamine (VYVANSE) 30 MG capsule; Take 1 capsule (30 mg total) by mouth daily.  Dispense: 30 capsule; Refill: 0  3. Seasonal allergic rhinitis, unspecified trigger   4. Sprain of collateral ligament of left knee, subsequent encounter  Continue follow up with Dr. Odis Luster

## 2017-06-07 NOTE — Addendum Note (Signed)
Addended by: Tommie RaymondBOOKER, CRYSTAL L on: 06/07/2017 10:45 AM   Modules accepted: Orders

## 2017-09-10 ENCOUNTER — Ambulatory Visit: Payer: Medicaid Other | Admitting: Family Medicine

## 2017-11-19 ENCOUNTER — Encounter: Payer: Self-pay | Admitting: Emergency Medicine

## 2017-11-19 ENCOUNTER — Encounter: Payer: Self-pay | Admitting: Family Medicine

## 2017-11-19 ENCOUNTER — Ambulatory Visit (INDEPENDENT_AMBULATORY_CARE_PROVIDER_SITE_OTHER): Payer: Medicaid Other | Admitting: Family Medicine

## 2017-11-19 VITALS — BP 110/70 | HR 69 | Temp 98.3°F | Resp 20 | Ht 67.0 in | Wt 134.0 lb

## 2017-11-19 DIAGNOSIS — F901 Attention-deficit hyperactivity disorder, predominantly hyperactive type: Secondary | ICD-10-CM

## 2017-11-19 DIAGNOSIS — J4540 Moderate persistent asthma, uncomplicated: Secondary | ICD-10-CM

## 2017-11-19 DIAGNOSIS — J302 Other seasonal allergic rhinitis: Secondary | ICD-10-CM | POA: Diagnosis not present

## 2017-11-19 DIAGNOSIS — M545 Low back pain, unspecified: Secondary | ICD-10-CM

## 2017-11-19 MED ORDER — TIZANIDINE HCL 4 MG PO TABS: 4.0000 mg | ORAL_TABLET | Freq: Four times a day (QID) | ORAL | 0 refills | Status: DC | PRN

## 2017-11-19 MED ORDER — LISDEXAMFETAMINE DIMESYLATE 30 MG PO CAPS: 30.0000 mg | ORAL_CAPSULE | Freq: Every day | ORAL | 0 refills | Status: DC

## 2017-11-19 MED ORDER — MELOXICAM 15 MG PO TABS: 15.0000 mg | ORAL_TABLET | Freq: Every day | ORAL | 0 refills | Status: DC

## 2017-11-19 MED ORDER — BECLOMETHASONE DIPROPIONATE 40 MCG/ACT IN AERS: 2.0000 | INHALATION_SPRAY | Freq: Two times a day (BID) | RESPIRATORY_TRACT | 2 refills | Status: DC

## 2017-11-19 MED ORDER — LORATADINE 10 MG PO TABS: 10.0000 mg | ORAL_TABLET | Freq: Every day | ORAL | 3 refills | Status: DC

## 2017-11-19 NOTE — Patient Instructions (Signed)

## 2017-11-19 NOTE — Progress Notes (Signed)
Name: Benjamin Pitts   MRN: [id number]    DOB: [birth year]   Date:11/19/2017       Progress Note  Subjective  Chief Complaint  Chief Complaint  Patient presents with  . Back Pain    pain in mid back that radiates around to ribs for 5 days. Hurt while lifting at work.  . Follow-up    ADHD refills  . Allergic Rhinitis     refill Claritin    HPI  Back Pain: 5 days ago he hurt his back at work - carried furniture back in from outside and was helping others lift some heavier items.  Later that night he felt some spasms in the mid back radiating to the low back.  Had trouble finding a comfortable position. 4 days later he wore a back brace and used electric STIM which helped a little bit. He is still having   ADHD with learning disability: he states He [facility] [employer] [facility].  Is working as a host at [employer].  Vyvanse to be able to function and stay focused. No side effects of medication. Denies change in appetite or palpitation. Due for refill today.  Does not always take medication every day.  AR: Doing well on claritin; no issues, but needs refill today  Asthma:he states he is doing better, no recent episodes of wheezing or cough, shortness of breath, or denies chest pain. He is now using Qvar daily; uses albuterol inhaler maybe once every few months - his symptoms are worse in the spring.   Patient Active Problem List   Diagnosis Date Noted  . Eczema 02/23/2015  . ADHD (attention deficit hyperactivity disorder) 02/23/2015  . Allergic rhinitis, seasonal 02/23/2015  . Asthma, mild intermittent 02/23/2015  . Learning problem 02/23/2015    Social History   Tobacco Use  . Smoking status: Never Smoker  . Smokeless tobacco: Never Used  Substance Use Topics  . Alcohol use: No    Alcohol/week: 0.0 oz     Current Outpatient Medications:  .  albuterol (PROVENTIL HFA;VENTOLIN HFA) 108 (90 Base) MCG/ACT inhaler, Inhale 2 puffs into the lungs every 6 (six) hours as needed for wheezing  or shortness of breath., Disp: 1 Inhaler, Rfl: 0 .  beclomethasone (QVAR) 40 MCG/ACT inhaler, Inhale 2 puffs into the lungs 2 (two) times daily., Disp: 1 Inhaler, Rfl: 2 .  lisdexamfetamine (VYVANSE) 30 MG capsule, Take 1 capsule (30 mg total) by mouth daily., Disp: 30 capsule, Rfl: 0 .  lisdexamfetamine (VYVANSE) 30 MG capsule, Take 1 capsule (30 mg total) by mouth daily., Disp: 30 capsule, Rfl: 0 .  lisdexamfetamine (VYVANSE) 30 MG capsule, Take 1 capsule (30 mg total) by mouth daily., Disp: 30 capsule, Rfl: 0 .  loratadine (CLARITIN) 10 MG tablet, Take 1 tablet (10 mg total) by mouth daily., Disp: 30 tablet, Rfl: 2 .  meloxicam (MOBIC) 15 MG tablet, Take 15 mg by mouth daily., Disp: , Rfl:   No Known Allergies  ROS  Constitutional: Negative for fever or weight change.  Respiratory: Negative for cough and shortness of breath.   Cardiovascular: Negative for chest pain or palpitations.  Gastrointestinal: Negative for abdominal pain, no bowel changes.  Musculoskeletal: Negative for gait problem or joint swelling. See HPI; denies numbness/tingling. Skin: Negative for rash.  Neurological: Negative for dizziness or headache.  No other specific complaints in a complete review of systems (except as listed in HPI above).  Objective  Vitals:   11/19/17 1259  BP: 110/70  Pulse: 69  Resp: 20  Temp: 98.3 F (36.8 C)  TempSrc: Oral  SpO2: 95%  Weight: 134 lb (60.8 kg)  Height: 5\' 7"  (1.702 m)   Body mass index is 20.99 kg/m.  Nursing Note and Vital Signs reviewed.  Physical Exam  Constitutional: Patient appears well-developed and well-nourished. No distress.  HEENT: head atraumatic, normocephalic Cardiovascular: Normal rate, regular rhythm, S1/S2 present.  No murmur or rub heard. No BLE edema. Pulmonary/Chest: Effort normal and breath sounds clear. No respiratory distress or retractions. Psychiatric: Patient has a normal mood and affect. behavior is normal. Judgment and thought  content normal.  Has some trouble completing full thought during conversation. Musculoskeletal: Normal range of motion, no joint effusions. No gross deformities. Mid back and low back musculature are palpably firm with occasional palpable spasm. Neurological: he is alert and oriented to person, place, and time. No cranial nerve deficit. Coordination, balance, strength, speech and gait are normal.  Skin: Skin is warm and dry. No rash noted. No erythema.   No results found for this or any previous visit (from the past 72 hour(s)).  Assessment & Plan  1. Acute bilateral low back pain without sciatica - Back exercises discussed; moist heat.  Work note for 3 days provided. - tiZANidine (ZANAFLEX) 4 MG tablet; Take 1 tablet (4 mg total) by mouth every 6 (six) hours as needed for muscle spasms.  Dispense: 20 tablet; Refill: 0 - meloxicam (MOBIC) 15 MG tablet; Take 1 tablet (15 mg total) by mouth daily.  Dispense: 20 tablet; Refill: 0  2. Attention-deficit hyperactivity disorder, predominantly hyperactive type - lisdexamfetamine (VYVANSE) 30 MG capsule; Take 1 capsule (30 mg total) by mouth daily.  Dispense: 30 capsule; Refill: 0 - lisdexamfetamine (VYVANSE) 30 MG capsule; Take 1 capsule (30 mg total) by mouth daily.  Dispense: 30 capsule; Refill: 0 - lisdexamfetamine (VYVANSE) 30 MG capsule; Take 1 capsule (30 mg total) by mouth daily.  Dispense: 30 capsule; Refill: 0 - Stable, doing well on current dose.  3. Seasonal allergic rhinitis, unspecified trigger - loratadine (CLARITIN) 10 MG tablet; Take 1 tablet (10 mg total) by mouth daily.  Dispense: 90 tablet; Refill: 3 - Stable  4. Moderate persistent asthma without complication - beclomethasone (QVAR) 40 MCG/ACT inhaler; Inhale 2 puffs into the lungs 2 (two) times daily.  Dispense: 1 Inhaler; Refill: 2 - Stable; albuterol PRN.  -Red flags and when to present for emergency care or RTC including fever >101.32F, chest pain, shortness of breath,  new/worsening/un-resolving symptoms, BLE numbness/tingling reviewed with patient at time of visit. Follow up and care instructions discussed and provided in AVS.

## 2017-11-22 ENCOUNTER — Other Ambulatory Visit: Payer: Self-pay | Admitting: Family Medicine

## 2017-11-22 ENCOUNTER — Telehealth: Payer: Self-pay | Admitting: Family Medicine

## 2017-11-22 MED ORDER — FLUTICASONE PROPIONATE HFA 44 MCG/ACT IN AERO: 2.0000 | INHALATION_SPRAY | Freq: Two times a day (BID) | RESPIRATORY_TRACT | 1 refills | Status: DC

## 2017-11-22 NOTE — Telephone Encounter (Signed)
Left message on patient's cell phone [phone] about his medication.

## 2017-11-22 NOTE — Telephone Encounter (Signed)
Copied from CRM [id number]. Topic: Quick Communication - See Telephone Encounter >> Nov 22, 2017 11:39 AM Windy KalataMichael, Margrette Wynia L, NT wrote: CRM for notification. See Telephone encounter for: 11/22/17.  [facility] is calling and requesting Dr. Carlynn PurlSowles nurse to return her call in regards to lisdexamfetamine (VYVANSE) 30 MG capsule. Please advise.  [phone]

## 2018-09-30 ENCOUNTER — Other Ambulatory Visit: Payer: Self-pay

## 2018-09-30 ENCOUNTER — Ambulatory Visit (INDEPENDENT_AMBULATORY_CARE_PROVIDER_SITE_OTHER): Payer: Medicaid Other | Admitting: Nurse Practitioner

## 2018-09-30 ENCOUNTER — Encounter: Payer: Self-pay | Admitting: Nurse Practitioner

## 2018-09-30 VITALS — BP 98/68 | HR 56 | Temp 98.4°F | Resp 16 | Ht 67.0 in | Wt 131.9 lb

## 2018-09-30 DIAGNOSIS — F331 Major depressive disorder, recurrent, moderate: Secondary | ICD-10-CM | POA: Diagnosis not present

## 2018-09-30 DIAGNOSIS — Z23 Encounter for immunization: Secondary | ICD-10-CM | POA: Diagnosis not present

## 2018-09-30 DIAGNOSIS — Z202 Contact with and (suspected) exposure to infections with a predominantly sexual mode of transmission: Secondary | ICD-10-CM

## 2018-09-30 MED ORDER — ESCITALOPRAM OXALATE 10 MG PO TABS: 10.0000 mg | ORAL_TABLET | Freq: Every day | ORAL | 2 refills | Status: DC

## 2018-09-30 NOTE — Patient Instructions (Signed)
[email or web address] Text [phone] to get connected with a Crisis Counselor, if you ever need someone to talk to right away.  If you have not heard anything from my staff in a week about labs from today, or one month about referral please contact us here to follow-up [phone].   - We are starting you on lexapro 10mg , take this medication daily with food and follow-up in 4-6 weeks so we can evaluate how it is working for you.
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
## 2018-10-01 LAB — HIV ANTIBODY (ROUTINE TESTING W REFLEX): HIV 1&2 Ab, 4th Generation: NONREACTIVE

## 2018-10-01 LAB — RPR: RPR Ser Ql: NONREACTIVE

## 2018-10-10 ENCOUNTER — Telehealth: Payer: Self-pay | Admitting: Family Medicine

## 2018-10-10 NOTE — Telephone Encounter (Signed)
Copied from CRM [id number]. Topic: General - Other >> Oct 10, 2018  4:06 PM Doreatha Massed wrote: Reason for CRM: pt called and want to know results of his HIV test

## 2018-10-11 NOTE — Telephone Encounter (Signed)
Patient called and notified.

## 2018-11-11 ENCOUNTER — Ambulatory Visit: Payer: Medicaid Other | Admitting: Family Medicine

## 2018-11-26 ENCOUNTER — Ambulatory Visit: Payer: Medicaid Other | Admitting: Family Medicine

## 2018-12-18 ENCOUNTER — Encounter: Payer: Self-pay | Admitting: Family Medicine

## 2018-12-18 ENCOUNTER — Ambulatory Visit (INDEPENDENT_AMBULATORY_CARE_PROVIDER_SITE_OTHER): Payer: Medicaid Other | Admitting: Family Medicine

## 2018-12-18 ENCOUNTER — Other Ambulatory Visit (HOSPITAL_COMMUNITY)
Admission: RE | Admit: 2018-12-18 | Discharge: 2018-12-18 | Disposition: A | Discharge status: Home / Self Care | Payer: Medicaid Other | Source: Ambulatory Visit | Attending: Family Medicine | Admitting: Family Medicine

## 2018-12-18 ENCOUNTER — Other Ambulatory Visit: Payer: Self-pay

## 2018-12-18 VITALS — BP 102/60 | HR 65 | Temp 97.5°F | Resp 16 | Ht 67.0 in | Wt 127.5 lb

## 2018-12-18 DIAGNOSIS — J302 Other seasonal allergic rhinitis: Secondary | ICD-10-CM | POA: Diagnosis not present

## 2018-12-18 DIAGNOSIS — Z113 Encounter for screening for infections with a predominantly sexual mode of transmission: Secondary | ICD-10-CM | POA: Diagnosis not present

## 2018-12-18 DIAGNOSIS — F331 Major depressive disorder, recurrent, moderate: Secondary | ICD-10-CM

## 2018-12-18 DIAGNOSIS — J4520 Mild intermittent asthma, uncomplicated: Secondary | ICD-10-CM | POA: Diagnosis not present

## 2018-12-18 DIAGNOSIS — F901 Attention-deficit hyperactivity disorder, predominantly hyperactive type: Secondary | ICD-10-CM

## 2018-12-18 MED ORDER — BUPROPION HCL ER (XL) 150 MG PO TB24: 150.0000 mg | ORAL_TABLET | Freq: Every day | ORAL | 1 refills | Status: DC

## 2018-12-18 MED ORDER — ESCITALOPRAM OXALATE 10 MG PO TABS: 10.0000 mg | ORAL_TABLET | Freq: Every day | ORAL | 1 refills | Status: DC

## 2018-12-18 MED ORDER — LISDEXAMFETAMINE DIMESYLATE 30 MG PO CAPS: 30.0000 mg | ORAL_CAPSULE | Freq: Every day | ORAL | 0 refills | Status: DC

## 2018-12-18 NOTE — Progress Notes (Signed)
Name: Benjamin Pitts   MRN: [id number]    DOB: [birth year]   Date:12/18/2018       Progress Note  Subjective  Chief Complaint  Chief Complaint  Patient presents with  . Follow-up    2 month F/U  . Asthma    Denies any symptoms  . Depression  . ADHD    Trouble focusing and concentrating sometimes    HPI  ADHD with learning disability: he states He started [employer] [facility]. He is not currently in school trying to go back, and is working as a host at [employer], he has a Insurance account managerjob coach and I applying for colleges and is spending a long time doing research and has to take Vyvanse to be able to function and stay focused. No side effects of medication. Denies change in appetite or palpitation   AR: he still has episodes of nasal congestion and sneezingbut occasionally.   Asthma:he states he is doing better, no recent episodes of wheezing or cough, denies chest pain at this time, last flare was June, use his inhaler and allergy medication - albuterol - and it resolved within 24 hours.   MDD: he is taking lexapro daily    Patient Active Problem List   Diagnosis Date Noted  . Eczema 02/23/2015  . ADHD (attention deficit hyperactivity disorder) 02/23/2015  . Allergic rhinitis, seasonal 02/23/2015  . Asthma, mild intermittent 02/23/2015  . Learning problem 02/23/2015    History reviewed. No pertinent surgical history.  Family History  Problem Relation Age of Onset  . Asthma Sister   . Seizures Sister     Social History   Socioeconomic History  . Marital status: Single    Spouse name: Not on file  . Number of children: 0  . Years of education: Not on file  . Highest education level: Associate degree: academic program  Occupational History  . Occupation: unemployed  Social Needs  . Financial resource strain: Not hard at all  . Food insecurity    Worry: Never true    Inability: Never true  . Transportation needs    Medical: No    Non-medical: No  Tobacco Use  . Smoking status:  Never Smoker  . Smokeless tobacco: Never Used  Substance and Sexual Activity  . Alcohol use: No    Alcohol/week: 0.0 standard drinks  . Drug use: No  . Sexual activity: Not Currently    Partners: Male, Male    Birth control/protection: Condom  Lifestyle  . Physical activity    Days per week: 2 days    Minutes per session: 60 min  . Stress: Not at all  Relationships  . Social connections    Talks on phone: More than three times a week    Gets together: Twice a week    Attends religious service: More than 4 times per year    Active member of club or organization: Yes    Attends meetings of clubs or organizations: More than 4 times per year    Relationship status: Never married  . Intimate partner violence    Fear of current or ex partner: No    Emotionally abused: No    Physically abused: No    Forced sexual activity: No  Other Topics Concern  . Not on file  Social History Narrative   Graduated HS Spring 2017   [facility] [employer], tried to transfer to [employer] but fell through - lack of paperwork    He wants to be a [employer]  coach     Current Outpatient Medications:  .  albuterol (PROVENTIL HFA;VENTOLIN HFA) 108 (90 Base) MCG/ACT inhaler, Inhale 2 puffs into the lungs every 6 (six) hours as needed for wheezing or shortness of breath., Disp: 1 Inhaler, Rfl: 0 .  escitalopram (LEXAPRO) 10 MG tablet, Take 1 tablet (10 mg total) by mouth daily., Disp: 30 tablet, Rfl: 1 .  fluticasone (FLOVENT HFA) 44 MCG/ACT inhaler, Inhale 2 puffs into the lungs 2 (two) times daily., Disp: 1 Inhaler, Rfl: 1 .  loratadine (CLARITIN) 10 MG tablet, Take 1 tablet (10 mg total) by mouth daily., Disp: 90 tablet, Rfl: 3 .  buPROPion (WELLBUTRIN XL) 150 MG 24 hr tablet, Take 1 tablet (150 mg total) by mouth daily., Disp: 30 tablet, Rfl: 1 .  lisdexamfetamine (VYVANSE) 30 MG capsule, Take 1 capsule (30 mg total) by mouth daily., Disp: 30 capsule, Rfl: 0  No Known Allergies  I personally reviewed active problem  list, medication list, allergies, family history, social history with the patient/caregiver today.   ROS  Constitutional: Negative for fever or weight change.  Respiratory: Negative for cough and shortness of breath.   Cardiovascular: Negative for chest pain or palpitations.  Gastrointestinal: Negative for abdominal pain, no bowel changes.  Musculoskeletal: Negative for gait problem or joint swelling.  Skin: Negative for rash.  Neurological: Negative for dizziness or headache.  No other specific complaints in a complete review of systems (except as listed in HPI above).  Objective  Vitals:   12/18/18 1508  BP: 102/60  Pulse: 65  Resp: 16  Temp: (!) 97.5 F (36.4 C)  TempSrc: Temporal  SpO2: 98%  Weight: 127 lb 8 oz (57.8 kg)  Height: 5\' 7"  (1.702 m)    Body mass index is 19.97 kg/m.  Physical Exam  Constitutional: Patient appears well-developed and well-nourished. No distress.  HEENT: head atraumatic, normocephalic, pupils equal and reactive to light,  neck supple Cardiovascular: Normal rate, regular rhythm and normal heart sounds.  No murmur heard. No BLE edema. Pulmonary/Chest: Effort normal and breath sounds normal. No respiratory distress. Abdominal: Soft.  There is no tenderness. Psychiatric: Patient has a normal mood and affect. behavior is normal. Judgment and thought content normal.  Recent Results (from the past 2160 hour(s))  HIV antibody (with reflex)     Status: None   Collection Time: 09/30/18  3:28 PM  Result Value Ref Range   HIV 1&2 Ab, 4th Generation NON-REACTIVE NON-REACTI    Comment: HIV-1 antigen and HIV-1/HIV-2 antibodies were not detected. There is no laboratory evidence of HIV infection. Marland Kitchen. PLEASE NOTE: This information has been disclosed to you from records whose confidentiality may be protected by state law.  If your state requires such protection, then the state law prohibits you from making any further disclosure of the  information without the specific written consent of the person to whom it pertains, or as otherwise permitted by law. A general authorization for the release of medical or other information is NOT sufficient for this purpose. . For additional information please refer to http://education.questdiagnostics.com/faq/FAQ106 (This link is being provided for informational/ educational purposes only.) . Marland Kitchen. The performance of this assay has not been clinically validated in patients less than 22 years old. .   RPR     Status: None   Collection Time: 09/30/18  3:28 PM  Result Value Ref Range   RPR Ser Ql NON-REACTIVE NON-REACTI     PHQ2/9: [facility] 12/18/2018 09/30/2018 11/28/2016 01/25/2016 11/24/2015  Decreased Interest 3 1 0 0 0  Down, Depressed, Hopeless 1 1 0 1 0  PHQ - 2 Score 4 2 0 1 0  Altered sleeping 2 1 - - -  Tired, decreased energy 1 1 - - -  Change in appetite 2 1 - - -  Feeling bad or failure about yourself  2 1 - - -  Trouble concentrating 1 1 - - -  Moving slowly or fidgety/restless 2 0 - - -  Suicidal thoughts 1 1 - - -  PHQ-9 Score 15 8 - - -  Difficult doing work/chores Somewhat difficult Somewhat difficult - - -    phq 9 is positive   Fall Risk: Fall Risk  12/18/2018 09/30/2018 06/07/2017 11/28/2016 01/25/2016  Falls in the past year? 0 0 Yes No No  Number falls in past yr: 0 0 1 - -  Injury with Fall? 0 0 Yes - -  Follow up - - Education provided - -     Functional Status Survey: Is the patient deaf or have difficulty hearing?: No Does the patient have difficulty seeing, even when wearing glasses/contacts?: No Does the patient have difficulty concentrating, remembering, or making decisions?: No Does the patient have difficulty walking or climbing stairs?: No Does the patient have difficulty dressing or bathing?: No Does the patient have difficulty doing errands alone such as visiting a doctor's office or shopping?: No    Assessment & Plan  1.  Mild intermittent asthma in adult without complication   2. Attention-deficit hyperactivity disorder, predominantly hyperactive type  - lisdexamfetamine (VYVANSE) 30 MG capsule; Take 1 capsule (30 mg total) by mouth daily.  Dispense: 30 capsule; Refill: 0  3. Seasonal allergic rhinitis, unspecified trigger   4. Moderate episode of recurrent major depressive disorder (HCC)  - escitalopram (LEXAPRO) 10 MG tablet; Take 1 tablet (10 mg total) by mouth daily.  Dispense: 30 tablet; Refill: 1 - buPROPion (WELLBUTRIN XL) 150 MG 24 hr tablet; Take 1 tablet (150 mg total) by mouth daily.  Dispense: 30 tablet; Refill: 1 - Ambulatory referral to Psychiatry  5. Routine screening for STI (sexually transmitted infection)  - HIV Antibody (routine testing w rflx) - RPR - Hepatitis panel, acute - Urine cytology ancillary only - Cytology (oral, anal, urethral) ancillary only

## 2018-12-18 NOTE — Patient Instructions (Signed)
Suicidal Feelings: How to Help Yourself Suicide is when you end your own life. There are many things you can do to help yourself feel better when struggling with these feelings. Many services and people are available to support you and others who struggle with similar feelings.  If you ever feel like you may hurt yourself or others, or have thoughts about taking your own life, get help right away. To get help:  Call your local emergency services (911 in the [place]  The [employer]'s health and human services helpline (211 in the [place]  Go to your nearest emergency department.  Call a suicide hotline to speak with a trained counselor. The following suicide hotlines are available in the [place]: ? 1-800-273-TALK (1-800-273-8255). ? 1-800-SUICIDE (1-800-784-2433). ? 1-888-628-9454. This is a hotline for Spanish speakers. ? 1-800-799-4889. This is a hotline for TTY users. ? 1-866-4-U-TREVOR (1-866-488-7386). This is a hotline for lesbian, gay, bisexual, transgender, or questioning youth. ? For a list of hotlines in [place], visit www.suicide.org/hotlines/international/[place]-suicide-hotlines.html  Contact a crisis center or a local suicide prevention center. To find a crisis center or suicide prevention center: ? Call your local hospital, clinic, community service organization, mental health center, social service provider, or health department. Ask for help with connecting to a crisis center. ? For a list of crisis centers in the [place], visit: suicidepreventionlifeline.org ? For a list of crisis centers in [place], visit: suicideprevention.ca How to help yourself feel better   Promise yourself that you will not do anything extreme when you have suicidal feelings. Remember, there is hope. Many people have gotten through suicidal thoughts and feelings, and you can too. If you have had these feelings before, remind yourself that you can get through them again.  Let family, friends,  teachers, or counselors know how you are feeling. Try not to separate yourself from those who care about you and want to help you. Talk with someone every day, even if you do not feel sociable. Face-to-face conversation is best to help them understand your feelings.  Contact a mental health care provider and work with this person regularly.  Make a safety plan that you can follow during a crisis. Include phone numbers of suicide prevention hotlines, mental health professionals, and trusted friends and family members you can call during an emergency. Save these numbers on your phone.  If you are thinking of taking a lot of medicine, give your medicine to someone who can give it to you as prescribed. If you are on antidepressants and are concerned you will overdose, tell your health care provider so that he or she can give you safer medicines.  Try to stick to your routines. Follow a schedule every day. Make self-care a priority.  Make a list of realistic goals, and cross them off when you achieve them. Accomplishments can give you a sense of worth.  Wait until you are feeling better before doing things that you find difficult or unpleasant.  Do things that you have always enjoyed to take your mind off your feelings. Try reading a book, or listening to or playing music. Spending time outside, in nature, may help you feel better. Follow these instructions at home:   Visit your primary health care provider every year for a checkup.  Work with a mental health care provider as needed.  Eat a well-balanced diet, and eat regular meals.  Get plenty of rest.  Exercise if you are able. Just 30 minutes of exercise each day can   help you feel better.  Take over-the-counter and prescription medicines only as told by your health care provider. Ask your mental health care provider about the possible side effects of any medicines you are taking.  Do not use alcohol or drugs, and remove these substances  from your home.  Remove weapons, poisons, knives, and other deadly items from your home. General recommendations  Keep your living space well lit.  When you are feeling well, write yourself a letter with tips and support that you can read when you are not feeling well.  Remember that life's difficulties can be sorted out with help. Conditions can be treated, and you can learn behaviors and ways of thinking that will help you. Where to find more information  National Suicide Prevention Lifeline: www.suicidepreventionlifeline.org  Hopeline: www.hopeline.com  American Foundation for Suicide Prevention: www.afsp.org  The Trevor Project (for lesbian, gay, bisexual, transgender, or questioning youth): www.thetrevorproject.org Contact a health care provider if:  You feel as though you are a burden to others.  You feel agitated, angry, vengeful, or have extreme mood swings.  You have withdrawn from family and friends. Get help right away if:  You are talking about suicide or wishing to die.  You start making plans for how to commit suicide.  You feel that you have no reason to live.  You start making plans for putting your affairs in order, saying goodbye, or giving your possessions away.  You feel guilt, shame, or unbearable pain, and it seems like there is no way out.  You are frequently using drugs or alcohol.  You are engaging in risky behaviors that could lead to death. If you have any of these symptoms, get help right away. Call emergency services, go to your nearest emergency department or crisis center, or call a suicide crisis helpline. Summary  Suicide is when you take your own life.  Promise yourself that you will not do anything extreme when you have suicidal feelings.  Let family, friends, teachers, or counselors know how you are feeling.  Get help right away if you feel as though life is getting too tough to handle and you are thinking about suicide. This  information is not intended to replace advice given to you by your health care provider. Make sure you discuss any questions you have with your health care provider. Document Released: 11/05/2002 Document Revised: 08/22/2018 Document Reviewed: 12/12/2016 Elsevier Patient Education  2020 Elsevier Inc.  

## 2018-12-19 ENCOUNTER — Other Ambulatory Visit: Payer: Self-pay | Admitting: Family Medicine

## 2018-12-19 DIAGNOSIS — A530 Latent syphilis, unspecified as early or late: Secondary | ICD-10-CM

## 2018-12-20 LAB — URINE CYTOLOGY ANCILLARY ONLY
Chlamydia: POSITIVE — AB
Neisseria Gonorrhea: POSITIVE — AB

## 2018-12-20 LAB — RPR: RPR Ser Ql: REACTIVE — AB

## 2018-12-20 LAB — HEPATITIS PANEL, ACUTE
Hep A IgM: NONREACTIVE
Hep B C IgM: NONREACTIVE
Hepatitis B Surface Ag: NONREACTIVE
Hepatitis C Ab: NONREACTIVE
SIGNAL TO CUT-OFF: 0.05 (ref <1.00)

## 2018-12-20 LAB — CYTOLOGY, (ORAL, ANAL, URETHRAL) ANCILLARY ONLY
Chlamydia: NEGATIVE
Neisseria Gonorrhea: POSITIVE — AB

## 2018-12-20 LAB — RPR TITER: RPR Titer: 1:1 {titer} — ABNORMAL HIGH

## 2018-12-20 LAB — FLUORESCENT TREPONEMAL AB(FTA)-IGG-BLD: Fluorescent Treponemal ABS: NONREACTIVE

## 2018-12-20 LAB — HIV ANTIBODY (ROUTINE TESTING W REFLEX): HIV 1&2 Ab, 4th Generation: NONREACTIVE

## 2018-12-23 ENCOUNTER — Ambulatory Visit (INDEPENDENT_AMBULATORY_CARE_PROVIDER_SITE_OTHER): Payer: Medicaid Other | Admitting: Family Medicine

## 2018-12-23 ENCOUNTER — Other Ambulatory Visit: Payer: Self-pay

## 2018-12-23 ENCOUNTER — Encounter: Payer: Self-pay | Admitting: Family Medicine

## 2018-12-23 VITALS — BP 100/60 | HR 62 | Temp 97.7°F | Resp 12 | Ht 67.0 in | Wt 128.2 lb

## 2018-12-23 DIAGNOSIS — Z7252 High risk homosexual behavior: Secondary | ICD-10-CM

## 2018-12-23 DIAGNOSIS — Z708 Other sex counseling: Secondary | ICD-10-CM | POA: Diagnosis not present

## 2018-12-23 DIAGNOSIS — A749 Chlamydial infection, unspecified: Secondary | ICD-10-CM | POA: Diagnosis not present

## 2018-12-23 DIAGNOSIS — A549 Gonococcal infection, unspecified: Secondary | ICD-10-CM

## 2018-12-23 MED ORDER — CEFTRIAXONE SODIUM 250 MG IJ SOLR
500.0000 mg | Freq: Once | INTRAMUSCULAR | Status: AC
  Administered 2018-12-24: 500 mg via INTRAMUSCULAR

## 2018-12-23 MED ORDER — TRUVADA 200-300 MG PO TABS: 1.0000 | ORAL_TABLET | Freq: Every day | ORAL | 2 refills | Status: DC

## 2018-12-23 MED ORDER — AZITHROMYCIN 500 MG PO TABS
1000.0000 mg | ORAL_TABLET | Freq: Once | ORAL | Status: AC
  Administered 2018-12-24: 1000 mg via ORAL

## 2018-12-23 NOTE — Progress Notes (Signed)
Name: Benjamin Pitts   MRN: [id number]    DOB: Aug 13, 1995   Date:12/23/2018       Progress Note  Subjective  Chief Complaint  Chief Complaint  Patient presents with  . Exposure to STD    HPI  GC positive: spent a long time explained importance of condom use, return in 2 months for test of cure and HIV testing, also discussed Truvada for prevention of HIV and acute onset of HIV symptoms. Answered questions and discussed possible side effects of Rocephin and Azithromycin . He denies penile discharge or rashes  Patient Active Problem List   Diagnosis Date Noted  . Eczema 02/23/2015  . ADHD (attention deficit hyperactivity disorder) 02/23/2015  . Allergic rhinitis, seasonal 02/23/2015  . Asthma, mild intermittent 02/23/2015  . Learning problem 02/23/2015    Social History   Tobacco Use  . Smoking status: Never Smoker  . Smokeless tobacco: Never Used  Substance Use Topics  . Alcohol use: No    Alcohol/week: 0.0 standard drinks     Current Outpatient Medications:  .  albuterol (PROVENTIL HFA;VENTOLIN HFA) 108 (90 Base) MCG/ACT inhaler, Inhale 2 puffs into the lungs every 6 (six) hours as needed for wheezing or shortness of breath., Disp: 1 Inhaler, Rfl: 0 .  buPROPion (WELLBUTRIN XL) 150 MG 24 hr tablet, Take 1 tablet (150 mg total) by mouth daily., Disp: 30 tablet, Rfl: 1 .  escitalopram (LEXAPRO) 10 MG tablet, Take 1 tablet (10 mg total) by mouth daily., Disp: 30 tablet, Rfl: 1 .  fluticasone (FLOVENT HFA) 44 MCG/ACT inhaler, Inhale 2 puffs into the lungs 2 (two) times daily., Disp: 1 Inhaler, Rfl: 1 .  lisdexamfetamine (VYVANSE) 30 MG capsule, Take 1 capsule (30 mg total) by mouth daily., Disp: 30 capsule, Rfl: 0 .  loratadine (CLARITIN) 10 MG tablet, Take 1 tablet (10 mg total) by mouth daily., Disp: 90 tablet, Rfl: 3 .  emtricitabine-tenofovir (TRUVADA) 200-300 MG tablet, Take 1 tablet by mouth daily., Disp: 30 tablet, Rfl: 2  Current Facility-Administered Medications:   .  azithromycin (ZITHROMAX) tablet 1,000 mg, 1,000 mg, Oral, Once, Alba Perillo, Danna HeftyKrichna, MD .  cefTRIAXone (ROCEPHIN) injection 500 mg, 500 mg, Intramuscular, Once, Alba CorySowles, Renny Remer, MD  No Known Allergies  ROS  Ten systems reviewed and is negative except as mentioned in HPI  Objective  Vitals:   12/23/18 1533  BP: 100/60  Pulse: 62  Resp: 12  Temp: 97.7 F (36.5 C)  TempSrc: Temporal  SpO2: 99%  Weight: 128 lb 3.2 oz (58.2 kg)  Height: 5\' 7"  (1.702 m)    Body mass index is 20.08 kg/m.    Physical Exam  Constitutional: Patient appears well-developed and well-nourished. No distress.  HEENT: head atraumatic, normocephalic, pupils equal and reactive to light, neck supple Cardiovascular: Normal rate, regular rhythm and normal heart sounds.  No murmur heard. No BLE edema. Pulmonary/Chest: Effort normal and breath sounds normal. No respiratory distress. Abdominal: Soft.  There is no tenderness. Psychiatric: Patient has a normal mood and affect. behavior is normal. Judgment and thought content normal.  Recent Results (from the past 2160 hour(s))  HIV antibody (with reflex)     Status: None   Collection Time: 09/30/18  3:28 PM  Result Value Ref Range   HIV 1&2 Ab, 4th Generation NON-REACTIVE NON-REACTI    Comment: HIV-1 antigen and HIV-1/HIV-2 antibodies were not detected. There is no laboratory evidence of HIV infection. Marland Kitchen. PLEASE NOTE: This information has been disclosed to you from records whose confidentiality  may be protected by state law.  If your state requires such protection, then the state law prohibits you from making any further disclosure of the information without the specific written consent of the person to whom it pertains, or as otherwise permitted by law. A general authorization for the release of medical or other information is NOT sufficient for this purpose. . For additional information please refer  to http://education.questdiagnostics.com/faq/FAQ106 (This link is being provided for informational/ educational purposes only.) . Marland Kitchen The performance of this assay has not been clinically validated in patients less than 49 years old. .   RPR     Status: None   Collection Time: 09/30/18  3:28 PM  Result Value Ref Range   RPR Ser Ql NON-REACTIVE NON-REACTI  Urine cytology ancillary only     Status: Abnormal   Collection Time: 12/18/18 12:00 AM  Result Value Ref Range   Chlamydia **POSITIVE** (A)     Comment: Normal Reference Range - Negative   Neisseria gonorrhea **POSITIVE** (A)     Comment: Normal Reference Range - Negative  Cytology (oral, anal, urethral) ancillary only     Status: Abnormal   Collection Time: 12/18/18 12:00 AM  Result Value Ref Range   Chlamydia Negative     Comment: Normal Reference Range - Negative   Neisseria gonorrhea **POSITIVE** (A)     Comment: Normal Reference Range - Negative  HIV Antibody (routine testing w rflx)     Status: None   Collection Time: 12/18/18  4:19 PM  Result Value Ref Range   HIV 1&2 Ab, 4th Generation NON-REACTIVE NON-REACTI    Comment: HIV-1 antigen and HIV-1/HIV-2 antibodies were not detected. There is no laboratory evidence of HIV infection. Marland Kitchen PLEASE NOTE: This information has been disclosed to you from records whose confidentiality may be protected by state law.  If your state requires such protection, then the state law prohibits you from making any further disclosure of the information without the specific written consent of the person to whom it pertains, or as otherwise permitted by law. A general authorization for the release of medical or other information is NOT sufficient for this purpose. . For additional information please refer to http://education.questdiagnostics.com/faq/FAQ106 (This link is being provided for informational/ educational purposes only.) . Marland Kitchen The performance of this assay has not been  clinically validated in patients less than 47 years old. .   RPR     Status: Abnormal   Collection Time: 12/18/18  4:19 PM  Result Value Ref Range   RPR Ser Ql REACTIVE (A) NON-REACTI  Hepatitis panel, acute     Status: None   Collection Time: 12/18/18  4:19 PM  Result Value Ref Range   Hep A IgM NON-REACTIVE NON-REACTI   Hepatitis B Surface Ag NON-REACTIVE NON-REACTI   Hep B C IgM NON-REACTIVE NON-REACTI   Hepatitis C Ab NON-REACTIVE NON-REACTI   SIGNAL TO CUT-OFF 0.05 <1.00    Comment: . HCV antibody was non-reactive. There is no laboratory  evidence of HCV infection. . In most cases, no further action is required. However, if recent HCV exposure is suspected, a test for HCV RNA (test code [phone]) is suggested. . For additional information please refer to http://education.questdiagnostics.com/faq/FAQ22v1 (This link is being provided for informational/ educational purposes only.) . Marland Kitchen For additional information, please refer to  http://education.questdiagnostics.com/faq/FAQ202  (This link is being provided for informational/ educational purposes only.) .   Rpr titer     Status: Abnormal   Collection Time: 12/18/18  4:19  PM  Result Value Ref Range   RPR Titer 1:1 (H)   Fluorescent treponemal ab(fta)-IgG-bld     Status: None   Collection Time: 12/18/18  4:19 PM  Result Value Ref Range   Fluorescent Treponemal ABS NON-REACTIVE NON-REACTI    Comment: Verified by repeat analysis. .      Assessment & Plan   1. Gonorrhea in male  - cefTRIAXone (ROCEPHIN) injection 500 mg  2. Chlamydia infection  - azithromycin (ZITHROMAX) tablet 1,000 mg  3. Encounter for sexually transmitted disease counseling   4. High risk homosexual behavior  Discussed medication, he is willing to try Truvada

## 2018-12-24 DIAGNOSIS — Z708 Other sex counseling: Secondary | ICD-10-CM | POA: Diagnosis not present

## 2018-12-24 DIAGNOSIS — Z7252 High risk homosexual behavior: Secondary | ICD-10-CM | POA: Diagnosis not present

## 2018-12-24 DIAGNOSIS — A549 Gonococcal infection, unspecified: Secondary | ICD-10-CM

## 2018-12-24 DIAGNOSIS — A749 Chlamydial infection, unspecified: Secondary | ICD-10-CM | POA: Diagnosis not present

## 2019-03-03 ENCOUNTER — Ambulatory Visit (INDEPENDENT_AMBULATORY_CARE_PROVIDER_SITE_OTHER): Payer: Medicaid Other | Admitting: Psychiatry

## 2019-03-03 ENCOUNTER — Other Ambulatory Visit: Payer: Self-pay

## 2019-03-03 DIAGNOSIS — Z5329 Procedure and treatment not carried out because of patient's decision for other reasons: Secondary | ICD-10-CM

## 2019-03-03 NOTE — Progress Notes (Signed)
No response to call 

## 2019-03-24 ENCOUNTER — Ambulatory Visit: Payer: Medicaid Other | Admitting: Family Medicine

## 2019-08-11 ENCOUNTER — Other Ambulatory Visit: Payer: Self-pay

## 2019-08-11 ENCOUNTER — Other Ambulatory Visit (HOSPITAL_COMMUNITY)
Admission: RE | Admit: 2019-08-11 | Discharge: 2019-08-11 | Disposition: A | Discharge status: Home / Self Care | Payer: Medicaid Other | Source: Ambulatory Visit | Attending: Family Medicine | Admitting: Family Medicine

## 2019-08-11 ENCOUNTER — Ambulatory Visit: Payer: Medicaid Other | Admitting: Family Medicine

## 2019-08-11 ENCOUNTER — Encounter: Payer: Self-pay | Admitting: Family Medicine

## 2019-08-11 VITALS — BP 120/64 | HR 73 | Temp 97.1°F | Resp 16 | Ht 67.0 in | Wt 140.8 lb

## 2019-08-11 DIAGNOSIS — J302 Other seasonal allergic rhinitis: Secondary | ICD-10-CM | POA: Diagnosis not present

## 2019-08-11 DIAGNOSIS — Z8619 Personal history of other infectious and parasitic diseases: Secondary | ICD-10-CM | POA: Insufficient documentation

## 2019-08-11 DIAGNOSIS — F341 Dysthymic disorder: Secondary | ICD-10-CM

## 2019-08-11 DIAGNOSIS — Z113 Encounter for screening for infections with a predominantly sexual mode of transmission: Secondary | ICD-10-CM

## 2019-08-11 DIAGNOSIS — Z7252 High risk homosexual behavior: Secondary | ICD-10-CM

## 2019-08-11 DIAGNOSIS — J4520 Mild intermittent asthma, uncomplicated: Secondary | ICD-10-CM | POA: Diagnosis not present

## 2019-08-11 DIAGNOSIS — F901 Attention-deficit hyperactivity disorder, predominantly hyperactive type: Secondary | ICD-10-CM

## 2019-08-11 MED ORDER — LISDEXAMFETAMINE DIMESYLATE 40 MG PO CAPS: 40.0000 mg | ORAL_CAPSULE | Freq: Every day | ORAL | 0 refills | Status: DC

## 2019-08-11 NOTE — Addendum Note (Signed)
Addended by: Tommie Raymond on: 08/11/2019 11:18 AM   Modules accepted: Orders

## 2019-08-11 NOTE — Progress Notes (Signed)
Name: Benjamin Pitts   MRN: [id number]    DOB: Nov 21, 1995   Date:08/11/2019       Progress Note  Subjective  Chief Complaint  Chief Complaint  Patient presents with  . Follow-up    On HIV testing. Continues to take Truvada as prescribed.     HPI  ADD with learning disability:  He started [facility] 2017. He is not currently enrolled in school, he is trying to decide if he will go back. He is currently not working, he is trying to get a job at [employer]. He ran out of  Vyvanse months ago but he states it helps him focus better, he states he would like to try a higher dose since around 1 pm it starts to wear off . 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 It also helps with his mood - decrease irritability   AR: he still has episodes of nasal congestion and sneezingbut occasionally. Does not seem to be seasonal, no problems at this time  Asthma:he states he is doing better, no recent episodes of wheezing or cough, denies chest pain at this time, last flare was June 2020, use his inhaler and allergy medication - albuterol - and it resolved within 24 hours.   MDD: he did not go see Dr. Elna Breslow, states he had car problems and did not know where it was. He states he would like to resume having therapy. He states last visit was in 2018  He does not want to take medications, he would like to have a therapist instead   History of STI/high risk : he is only taking truvada a couple of times a week, no side effects, Explained it was studied for daily use He was treated with Rocephin and Azithomycin in our office 12/2018 for [facility] but lost to follow up, we will recheck it today   Patient Active Problem List   Diagnosis Date Noted  . Eczema 02/23/2015  . ADHD (attention deficit hyperactivity disorder) 02/23/2015  . Allergic rhinitis, seasonal 02/23/2015  . Asthma, mild intermittent 02/23/2015  . Learning problem 02/23/2015    History reviewed. No pertinent  surgical history.  Family History  Problem Relation Age of Onset  . Asthma Sister   . Seizures Sister     Social History   Tobacco Use  . Smoking status: Never Smoker  . Smokeless tobacco: Never Used  Substance Use Topics  . Alcohol use: No    Alcohol/week: 0.0 standard drinks     Current Outpatient Medications:  .  albuterol (PROVENTIL HFA;VENTOLIN HFA) 108 (90 Base) MCG/ACT inhaler, Inhale 2 puffs into the lungs every 6 (six) hours as needed for wheezing or shortness of breath., Disp: 1 Inhaler, Rfl: 0 .  emtricitabine-tenofovir (TRUVADA) 200-300 MG tablet, Take 1 tablet by mouth daily., Disp: 30 tablet, Rfl: 2 .  loratadine (CLARITIN) 10 MG tablet, Take 1 tablet (10 mg total) by mouth daily., Disp: 90 tablet, Rfl: 3 .  lisdexamfetamine (VYVANSE) 30 MG capsule, Take 1 capsule (30 mg total) by mouth daily., Disp: 30 capsule, Rfl: 0  No Known Allergies  I personally reviewed active problem list, medication list, allergies, family history, social history, health maintenance with the patient/caregiver today.   ROS  Constitutional: Negative for fever or weight change.  Respiratory: Negative for cough and shortness of breath.   Cardiovascular: Negative for chest pain or palpitations.  Gastrointestinal: Negative for abdominal pain, no bowel changes.  Musculoskeletal: Negative for  gait problem or joint swelling.  Skin: Negative for rash.  Neurological: Negative for dizziness or headache.  No other specific complaints in a complete review of systems (except as listed in HPI above).  Objective  Vitals:   08/11/19 0956  BP: 120/64  Pulse: 73  Resp: 16  Temp: (!) 97.1 F (36.2 C)  TempSrc: Temporal  SpO2: 98%  Weight: 140 lb 12.8 oz (63.9 kg)  Height: 5\' 7"  (1.702 m)    Body mass index is 22.05 kg/m.  Physical Exam  Constitutional: Patient appears well-developed and well-nourished.  No distress.  HEENT: head atraumatic, normocephalic, pupils equal and reactive to  light Cardiovascular: Normal rate, regular rhythm and normal heart sounds.  No murmur heard. No BLE edema. Pulmonary/Chest: Effort normal and breath sounds normal. No respiratory distress. Abdominal: Soft.  There is no tenderness. Psychiatric: Patient has a normal mood and affect. behavior is normal. Judgment and thought content normal.  PHQ2/9: Depression screen [facility] 2/9 08/11/2019 12/18/2018 09/30/2018 11/28/2016 01/25/2016  Decreased Interest 0 3 1 0 0  Down, Depressed, Hopeless 0 1 1 0 1  PHQ - 2 Score 0 4 2 0 1  Altered sleeping 0 2 1 - -  Tired, decreased energy 0 1 1 - -  Change in appetite 0 2 1 - -  Feeling bad or failure about yourself  0 2 1 - -  Trouble concentrating 0 1 1 - -  Moving slowly or fidgety/restless 0 2 0 - -  Suicidal thoughts 0 1 1 - -  PHQ-9 Score 0 15 8 - -  Difficult doing work/chores - Somewhat difficult Somewhat difficult - -    phq 9 is negative   Fall Risk: Fall Risk  08/11/2019 12/23/2018 12/18/2018 09/30/2018 06/07/2017  Falls in the past year? 0 0 0 0 Yes  Number falls in past yr: 0 0 0 0 1  Injury with Fall? 0 0 0 0 Yes  Follow up - - - - Education provided    Functional Status Survey: Is the patient deaf or have difficulty hearing?: No Does the patient have difficulty seeing, even when wearing glasses/contacts?: No Does the patient have difficulty concentrating, remembering, or making decisions?: No Does the patient have difficulty walking or climbing stairs?: No Does the patient have difficulty dressing or bathing?: No Does the patient have difficulty doing errands alone such as visiting a doctor's office or shopping?: No    Assessment & Plan  1. Attention-deficit hyperactivity disorder, predominantly hyperactive type  - lisdexamfetamine (VYVANSE) 40 MG capsule; Take 1 capsule (40 mg total) by mouth daily.  Dispense: 30 capsule; Refill: 0  2. Routine screening for STI (sexually transmitted infection)  - HIV Antibody (routine testing w  rflx) - RPR - Cytology (oral, anal, urethral) ancillary only  3. History of gonorrhea  - HIV Antibody (routine testing w rflx) - RPR - Cytology (oral, anal, urethral) ancillary only  4. High risk homosexual behavior  - HIV Antibody (routine testing w rflx) - RPR - Cytology (oral, anal, urethral) ancillary only  5. Mild intermittent asthma in adult without complication   6. Seasonal allergic rhinitis, unspecified trigger   7. Dysthymia  - Ambulatory referral to Psychology

## 2019-08-12 ENCOUNTER — Other Ambulatory Visit (HOSPITAL_COMMUNITY)
Admission: RE | Admit: 2019-08-12 | Discharge: 2019-08-12 | Disposition: A | Discharge status: Home / Self Care | Payer: Medicaid Other | Source: Ambulatory Visit | Attending: Family Medicine | Admitting: Family Medicine

## 2019-08-12 DIAGNOSIS — Z8619 Personal history of other infectious and parasitic diseases: Secondary | ICD-10-CM | POA: Insufficient documentation

## 2019-08-12 DIAGNOSIS — Z7252 High risk homosexual behavior: Secondary | ICD-10-CM | POA: Insufficient documentation

## 2019-08-12 LAB — CYTOLOGY, (ORAL, ANAL, URETHRAL) ANCILLARY ONLY
Chlamydia: NEGATIVE
Comment: NEGATIVE
Comment: NORMAL
Neisseria Gonorrhea: NEGATIVE

## 2019-08-12 LAB — RPR: RPR Ser Ql: NONREACTIVE

## 2019-08-12 LAB — HIV ANTIBODY (ROUTINE TESTING W REFLEX): HIV 1&2 Ab, 4th Generation: NONREACTIVE

## 2019-08-12 NOTE — Addendum Note (Signed)
Addended by: Alba Cory F on: 08/12/2019 05:00 PM   Modules accepted: Orders

## 2019-08-14 ENCOUNTER — Telehealth: Payer: Self-pay | Admitting: Family Medicine

## 2019-08-14 NOTE — Telephone Encounter (Signed)
Patient receive lab results. Would like to know was he tested for chylemdia and other STD's besides HIV? And what where the results? [phone]

## 2019-08-15 LAB — URINE CYTOLOGY ANCILLARY ONLY
Chlamydia: NEGATIVE
Comment: NEGATIVE
Comment: NORMAL
Neisseria Gonorrhea: NEGATIVE

## 2019-08-21 ENCOUNTER — Other Ambulatory Visit: Payer: Self-pay | Admitting: Family Medicine

## 2019-08-21 DIAGNOSIS — F901 Attention-deficit hyperactivity disorder, predominantly hyperactive type: Secondary | ICD-10-CM

## 2019-08-21 DIAGNOSIS — J4540 Moderate persistent asthma, uncomplicated: Secondary | ICD-10-CM

## 2019-08-21 DIAGNOSIS — Z7252 High risk homosexual behavior: Secondary | ICD-10-CM

## 2019-08-21 NOTE — Telephone Encounter (Signed)
Pt states he needs all refill sent to a different pharmacy.   lisdexamfetamine (VYVANSE) 40 MG capsule  emtricitabine-tenofovir (TRUVADA) 200-300 MG tablet   albuterol (PROVENTIL HFA;VENTOLIN HFA) 108 (90 Base) MCG/ACT inhaler    Sent to   [id number] [address],  - [address] AT [facility] Phone:  [phone]  Fax:  [phone]     Pt also stated he has heard nothing from the behavioral health

## 2019-08-23 MED ORDER — EMTRICITABINE-TENOFOVIR DF 200-300 MG PO TABS: 1.0000 | ORAL_TABLET | Freq: Every day | ORAL | 2 refills | Status: DC

## 2019-08-23 MED ORDER — ALBUTEROL SULFATE HFA 108 (90 BASE) MCG/ACT IN AERS: 2.0000 | INHALATION_SPRAY | Freq: Four times a day (QID) | RESPIRATORY_TRACT | 0 refills | Status: DC | PRN

## 2019-11-11 ENCOUNTER — Encounter: Payer: Self-pay | Admitting: Family Medicine

## 2019-11-11 ENCOUNTER — Ambulatory Visit (INDEPENDENT_AMBULATORY_CARE_PROVIDER_SITE_OTHER): Payer: Medicaid Other | Admitting: Family Medicine

## 2019-11-11 ENCOUNTER — Other Ambulatory Visit: Payer: Self-pay

## 2019-11-11 VITALS — BP 120/70 | HR 76 | Temp 98.5°F | Resp 14 | Ht 67.0 in | Wt 137.8 lb

## 2019-11-11 DIAGNOSIS — J4520 Mild intermittent asthma, uncomplicated: Secondary | ICD-10-CM

## 2019-11-11 DIAGNOSIS — F901 Attention-deficit hyperactivity disorder, predominantly hyperactive type: Secondary | ICD-10-CM

## 2019-11-11 DIAGNOSIS — J302 Other seasonal allergic rhinitis: Secondary | ICD-10-CM | POA: Diagnosis not present

## 2019-11-11 DIAGNOSIS — Z7252 High risk homosexual behavior: Secondary | ICD-10-CM | POA: Diagnosis not present

## 2019-11-11 DIAGNOSIS — F325 Major depressive disorder, single episode, in full remission: Secondary | ICD-10-CM | POA: Diagnosis not present

## 2019-11-11 DIAGNOSIS — F331 Major depressive disorder, recurrent, moderate: Secondary | ICD-10-CM | POA: Insufficient documentation

## 2019-11-11 DIAGNOSIS — Z23 Encounter for immunization: Secondary | ICD-10-CM

## 2019-11-11 MED ORDER — LISDEXAMFETAMINE DIMESYLATE 40 MG PO CAPS: 40.0000 mg | ORAL_CAPSULE | Freq: Every day | ORAL | 0 refills | Status: DC

## 2019-11-11 MED ORDER — EMTRICITABINE-TENOFOVIR DF 200-300 MG PO TABS: 1.0000 | ORAL_TABLET | Freq: Every day | ORAL | 2 refills | Status: DC

## 2019-11-11 MED ORDER — LORATADINE 10 MG PO TABS: 10.0000 mg | ORAL_TABLET | Freq: Every day | ORAL | 3 refills | Status: AC

## 2019-11-11 NOTE — Progress Notes (Signed)
Name: Benjamin Pitts   MRN: [id number]    DOB: [birth year]   Date:11/11/2019       Progress Note  Subjective  Chief Complaint  Chief Complaint  Patient presents with  . Medication Refill  . Follow-up    ADHD    HPI  ADD with learning disability:  He started [facility] 2017. He is not currently enrolled in school, he is trying to decide if he will go back. 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 He ran out of  Vyvanse months ago but he states it helps him focus better, we sent rx last visit but it went to the wrong pharmacy, we will resend it today   AR: he still has episodes of nasal congestion and sneezingbut occasionally.Unchanged , just taking loratadine prn   Asthma:he states he is doing better, no recent episodes of wheezing or cough, denies chest painat this time, last flare was June 2020. Discussed importance of flu and COVID-19 vaccine , he is willing to get pneumonia vaccine   MDD: he did not go see Dr. Elna Breslow, states he had car problems and did not know where it was. He has been feeling better, he has been more mindful. He did not go back to therapy. He states he is doing fine at this time. Phq 9 was negative   History of STI/high risk : he states not sexually active lately, but has condoms with him, not taking Truvada because rx went to the wrong pharmacy, discussed last STI screen labs and all negative   Patient Active Problem List   Diagnosis Date Noted  . Eczema 02/23/2015  . ADHD (attention deficit hyperactivity disorder) 02/23/2015  . Allergic rhinitis, seasonal 02/23/2015  . Asthma, mild intermittent 02/23/2015  . Learning problem 02/23/2015    History reviewed. No pertinent surgical history.  Family History  Problem Relation Age of Onset  . Asthma Sister   . Seizures Sister     Social History   Tobacco Use  . Smoking status: Never Smoker  .  Smokeless tobacco: Never Used  Substance Use Topics  . Alcohol use: No    Alcohol/week: 0.0 standard drinks     Current Outpatient Medications:  .  albuterol (VENTOLIN HFA) 108 (90 Base) MCG/ACT inhaler, Inhale 2 puffs into the lungs every 6 (six) hours as needed for wheezing or shortness of breath., Disp: 18 g, Rfl: 0 .  emtricitabine-tenofovir (TRUVADA) 200-300 MG tablet, Take 1 tablet by mouth daily., Disp: 30 tablet, Rfl: 2 .  loratadine (CLARITIN) 10 MG tablet, Take 1 tablet (10 mg total) by mouth daily., Disp: 90 tablet, Rfl: 3 .  lisdexamfetamine (VYVANSE) 40 MG capsule, Take 1 capsule (40 mg total) by mouth daily., Disp: 30 capsule, Rfl: 0  No Known Allergies  I personally reviewed active problem list, medication list, allergies, family history, social history, health maintenance with the patient/caregiver today.   ROS  Constitutional: Negative for fever or weight change.  Respiratory: Negative for cough and shortness of breath.   Cardiovascular: Negative for chest pain or palpitations.  Gastrointestinal: Negative for abdominal pain, no bowel changes.  Musculoskeletal: Negative for gait problem or joint swelling.  Skin: Negative for rash.  Neurological: Negative for dizziness or headache.  No other specific complaints in a complete review of systems (except as listed in HPI above).  Objective  Vitals:   11/11/19 1307  BP: 120/70  Pulse: 76  Resp: 14  Temp: 98.5 F (36.9 C)  TempSrc: Temporal  SpO2: 98%  Weight: 137 lb 12.8 oz (62.5 kg)  Height: 5\' 7"  (1.702 m)    Body mass index is 21.58 kg/m.  Physical Exam  Constitutional: Patient appears well-developed and well-nourished.No distress.  HEENT: head atraumatic, normocephalic, pupils equal and reactive to light, neck supple Cardiovascular: Normal rate, regular rhythm and normal heart sounds.  No murmur heard. No BLE edema. Pulmonary/Chest: Effort normal and breath sounds normal. No respiratory  distress. Abdominal: Soft.  There is no tenderness. Psychiatric: Patient has a normal mood and affect. behavior is normal. Judgment and thought content normal.   PHQ2/9: Depression screen [facility] 2/9 11/11/2019 08/11/2019 12/18/2018 09/30/2018 11/28/2016  Decreased Interest 0 0 3 1 0  Down, Depressed, Hopeless 0 0 1 1 0  PHQ - 2 Score 0 0 4 2 0  Altered sleeping 0 0 2 1 -  Tired, decreased energy 0 0 1 1 -  Change in appetite 0 0 2 1 -  Feeling bad or failure about yourself  0 0 2 1 -  Trouble concentrating 0 0 1 1 -  Moving slowly or fidgety/restless 0 0 2 0 -  Suicidal thoughts 0 0 1 1 -  PHQ-9 Score 0 0 15 8 -  Difficult doing work/chores - - Somewhat difficult Somewhat difficult -    phq 9 is negative   Fall Risk: Fall Risk  11/11/2019 08/11/2019 12/23/2018 12/18/2018 09/30/2018  Falls in the past year? 0 0 0 0 0  Number falls in past yr: 0 0 0 0 0  Injury with Fall? 0 0 0 0 0  Follow up - - - - -     Functional Status Survey: Is the patient deaf or have difficulty hearing?: No Does the patient have difficulty seeing, even when wearing glasses/contacts?: No Does the patient have difficulty concentrating, remembering, or making decisions?: No Does the patient have difficulty walking or climbing stairs?: No Does the patient have difficulty dressing or bathing?: No Does the patient have difficulty doing errands alone such as visiting a doctor's office or shopping?: No    Assessment & Plan  1. Attention-deficit hyperactivity disorder, predominantly hyperactive type  - lisdexamfetamine (VYVANSE) 40 MG capsule; Take 1 capsule (40 mg total) by mouth daily.  Dispense: 30 capsule; Refill: 0  2. High risk homosexual behavior  - emtricitabine-tenofovir (TRUVADA) 200-300 MG tablet; Take 1 tablet by mouth daily.  Dispense: 30 tablet; Refill: 2  3. Seasonal allergic rhinitis, unspecified trigger  - loratadine (CLARITIN) 10 MG tablet; Take 1 tablet (10 mg total) by mouth daily.  Dispense:  90 tablet; Refill: 3  4. Major depression in remission (HCC)  Seems to be doing well at this time   5. Mild intermittent asthma without complication  Doing well at this time  6. Need for pneumococcal vaccination  - Pneumococcal polysaccharide vaccine 23-valent greater than or equal to 2yo subcutaneous/IM

## 2019-11-12 ENCOUNTER — Telehealth: Payer: Self-pay | Admitting: Family Medicine

## 2019-11-18 ENCOUNTER — Ambulatory Visit: Payer: Medicaid Other | Admitting: Family Medicine

## 2020-02-11 ENCOUNTER — Ambulatory Visit: Payer: Medicaid Other | Admitting: Family Medicine

## 2020-06-08 NOTE — Progress Notes (Signed)
Name: Benjamin Pitts   MRN: [id number]    DOB: 05-29-1995   Date:06/09/2020       Progress Note  Subjective  Chief Complaint  Follow up/Medication Refill  HPI   ADD with learning disability:  He has a part time job, he has difficulty managing his money, also difficulty with focus and is trying to get a full time job. He would like a refill of Vyvanse. He is asking his grandmother to help him with budgeting and writing goals down so he can follow through and try to move out of his grandmother's house.   AR: he still has episodes of nasal congestion and sneezingbut occasionally. He takes loratadine prn   Asthma: he states over the past 4 days he has noticed some sob and wheezing, he has been using inhaler prn . No cough or cold symptoms   MDD: he did not go see Dr. Elna Breslow, states he had car problems and did not know where it was. He has been feeling better, he has been more mindful, phq 9 is positive. He would like refills of Lexapro and wellbutrin   History of STI/high risk : he states not sexually active lately, he has condoms, he is bisexual, he would like a refill of truvada. Check for STI today    Patient Active Problem List   Diagnosis Date Noted  . Moderate episode of recurrent major depressive disorder (HCC) 11/11/2019  . Eczema 02/23/2015  . ADHD (attention deficit hyperactivity disorder) 02/23/2015  . Allergic rhinitis, seasonal 02/23/2015  . Asthma, mild intermittent 02/23/2015  . Learning problem 02/23/2015    History reviewed. No pertinent surgical history.  Family History  Problem Relation Age of Onset  . Asthma Sister   . Seizures Sister     Social History   Tobacco Use  . Smoking status: Never Smoker  . Smokeless tobacco: Never Used  Substance Use Topics  . Alcohol use: No    Alcohol/week: 0.0 standard drinks     Current Outpatient Medications:  .  Fluticasone-Salmeterol (ADVAIR) 100-50 MCG/DOSE AEPB, Inhale 1 puff into the lungs 2 (two) times  daily., Disp: 1 each, Rfl: 2 .  lisdexamfetamine (VYVANSE) 40 MG capsule, Take 1 capsule (40 mg total) by mouth every morning., Disp: 30 capsule, Rfl: 0 .  lisdexamfetamine (VYVANSE) 40 MG capsule, Take 1 capsule (40 mg total) by mouth every morning., Disp: 30 capsule, Rfl: 0 .  loratadine (CLARITIN) 10 MG tablet, Take 1 tablet (10 mg total) by mouth daily., Disp: 90 tablet, Rfl: 3 .  albuterol (VENTOLIN HFA) 108 (90 Base) MCG/ACT inhaler, Inhale 2 puffs into the lungs every 6 (six) hours as needed for wheezing or shortness of breath., Disp: 18 g, Rfl: 0 .  buPROPion (WELLBUTRIN XL) 150 MG 24 hr tablet, Take 1 tablet (150 mg total) by mouth at bedtime., Disp: 90 tablet, Rfl: 1 .  emtricitabine-tenofovir (TRUVADA) 200-300 MG tablet, Take 1 tablet by mouth daily., Disp: 90 tablet, Rfl: 1 .  escitalopram (LEXAPRO) 10 MG tablet, Take 1 tablet (10 mg total) by mouth daily., Disp: 90 tablet, Rfl: 1 .  lisdexamfetamine (VYVANSE) 40 MG capsule, Take 1 capsule (40 mg total) by mouth daily., Disp: 30 capsule, Rfl: 0  No Known Allergies  I personally reviewed active problem list, medication list, allergies, family history, social history, health maintenance with the patient/caregiver today.   ROS  Constitutional: Negative for fever or weight change.  Respiratory: Negative for cough , he has noticed some  shortness  of breath.   Cardiovascular: Negative for chest pain or palpitations.  Gastrointestinal: Negative for abdominal pain, no bowel changes.  Musculoskeletal: Negative for gait problem or joint swelling.  Skin: Negative for rash.  Neurological: Negative for dizziness or headache.  No other specific complaints in a complete review of systems (except as listed in HPI above).  Objective  Vitals:   06/09/20 1339  BP: 114/72  Pulse: 74  Resp: 14  Temp: 98.1 F (36.7 C)  TempSrc: Oral  SpO2: 97%  Weight: 143 lb 8 oz (65.1 kg)  Height: 5\' 7"  (1.702 m)    Body mass index is 22.48  kg/m.  Physical Exam  Constitutional: Patient appears well-developed and well-nourished.no distress.  HEENT: head atraumatic, normocephalic, pupils equal and reactive to light, neck supple Cardiovascular: Normal rate, regular rhythm and normal heart sounds.  No murmur heard. No BLE edema. Pulmonary/Chest: Effort normal and breath sounds normal. No respiratory distress. Abdominal: Soft.  There is no tenderness. Psychiatric: Patient has a normal mood and affect. behavior is normal. Judgment and thought content normal.  PHQ2/9: Depression screen [facility] 2/9 06/09/2020 11/11/2019 08/11/2019 12/18/2018 09/30/2018  Decreased Interest 0 0 0 3 1  Down, Depressed, Hopeless 1 0 0 1 1  PHQ - 2 Score 1 0 0 4 2  Altered sleeping 1 0 0 2 1  Tired, decreased energy 2 0 0 1 1  Change in appetite 0 0 0 2 1  Feeling bad or failure about yourself  0 0 0 2 1  Trouble concentrating 1 0 0 1 1  Moving slowly or fidgety/restless 0 0 0 2 0  Suicidal thoughts 0 0 0 1 1  PHQ-9 Score 5 0 0 15 8  Difficult doing work/chores Somewhat difficult - - Somewhat difficult Somewhat difficult    phq 9 is positive   Fall Risk: Fall Risk  06/09/2020 11/11/2019 08/11/2019 12/23/2018 12/18/2018  Falls in the past year? 0 0 0 0 0  Number falls in past yr: 0 0 0 0 0  Injury with Fall? 0 0 0 0 0  Follow up - - - - -     Functional Status Survey: Is the patient deaf or have difficulty hearing?: No Does the patient have difficulty seeing, even when wearing glasses/contacts?: No Does the patient have difficulty concentrating, remembering, or making decisions?: Yes Does the patient have difficulty walking or climbing stairs?: No Does the patient have difficulty dressing or bathing?: No Does the patient have difficulty doing errands alone such as visiting a doctor's office or shopping?: No    Assessment & Plan  1. Mild intermittent asthma with acute exacerbation  - albuterol (VENTOLIN HFA) 108 (90 Base) MCG/ACT inhaler; Inhale 2  puffs into the lungs every 6 (six) hours as needed for wheezing or shortness of breath.  Dispense: 18 g; Refill: 0 - CBC with Differential/Platelet - COMPLETE METABOLIC PANEL WITH GFR - Fluticasone-Salmeterol (ADVAIR) 100-50 MCG/DOSE AEPB; Inhale 1 puff into the lungs 2 (two) times daily.  Dispense: 1 each; Refill: 2  2. Attention-deficit hyperactivity disorder, predominantly hyperactive type  - lisdexamfetamine (VYVANSE) 40 MG capsule; Take 1 capsule (40 mg total) by mouth daily.  Dispense: 30 capsule; Refill: 0 - lisdexamfetamine (VYVANSE) 40 MG capsule; Take 1 capsule (40 mg total) by mouth every morning.  Dispense: 30 capsule; Refill: 0 - lisdexamfetamine (VYVANSE) 40 MG capsule; Take 1 capsule (40 mg total) by mouth every morning.  Dispense: 30 capsule; Refill: 0  3. Major depression mild recurrent (  HCC)  - buPROPion (WELLBUTRIN XL) 150 MG 24 hr tablet; Take 1 tablet (150 mg total) by mouth at bedtime.  Dispense: 90 tablet; Refill: 1 - escitalopram (LEXAPRO) 10 MG tablet; Take 1 tablet (10 mg total) by mouth daily.  Dispense: 90 tablet; Refill: 1  4. Need for immunization against influenza  - Flu Vaccine QUAD 36+ mos IM  5. Routine screening for STI (sexually transmitted infection)  - emtricitabine-tenofovir (TRUVADA) 200-300 MG tablet; Take 1 tablet by mouth daily.  Dispense: 90 tablet; Refill: 1 - R[facility] - HIV Antibody (routine testing w rflx) - Cytology (oral, anal, urethral) ancillary only  6. High risk bisexual behavior  - emtricitabine-tenofovir (TRUVADA) 200-300 MG tablet; Take 1 tablet by mouth daily.  Dispense: 90 tablet; Refill: 1 - RPR - HIV Antibody (routine testing w rflx) - Cytology (oral, anal, urethral) ancillary only  7. Diabetes mellitus screening  - Hemoglobin A1c  8. Lipid screening  - Lipid panel  9. Need for hepatitis C screening test  - Hepatitis C antibody

## 2020-06-09 ENCOUNTER — Ambulatory Visit: Payer: Medicaid Other | Admitting: Family Medicine

## 2020-06-09 ENCOUNTER — Encounter: Payer: Self-pay | Admitting: Family Medicine

## 2020-06-09 ENCOUNTER — Other Ambulatory Visit: Payer: Self-pay

## 2020-06-09 VITALS — BP 114/72 | HR 74 | Temp 98.1°F | Resp 14 | Ht 67.0 in | Wt 143.5 lb

## 2020-06-09 DIAGNOSIS — Z23 Encounter for immunization: Secondary | ICD-10-CM

## 2020-06-09 DIAGNOSIS — Z1159 Encounter for screening for other viral diseases: Secondary | ICD-10-CM

## 2020-06-09 DIAGNOSIS — Z131 Encounter for screening for diabetes mellitus: Secondary | ICD-10-CM | POA: Diagnosis not present

## 2020-06-09 DIAGNOSIS — Z7253 High risk bisexual behavior: Secondary | ICD-10-CM

## 2020-06-09 DIAGNOSIS — F339 Major depressive disorder, recurrent, unspecified: Secondary | ICD-10-CM | POA: Diagnosis not present

## 2020-06-09 DIAGNOSIS — Z13220 Encounter for screening for lipoid disorders: Secondary | ICD-10-CM | POA: Diagnosis not present

## 2020-06-09 DIAGNOSIS — F901 Attention-deficit hyperactivity disorder, predominantly hyperactive type: Secondary | ICD-10-CM

## 2020-06-09 DIAGNOSIS — Z113 Encounter for screening for infections with a predominantly sexual mode of transmission: Secondary | ICD-10-CM

## 2020-06-09 DIAGNOSIS — J4521 Mild intermittent asthma with (acute) exacerbation: Secondary | ICD-10-CM

## 2020-06-09 MED ORDER — BUPROPION HCL ER (XL) 150 MG PO TB24
150.0000 mg | ORAL_TABLET | Freq: Every day | ORAL | 1 refills | Status: DC
Start: 2020-06-09 — End: 2020-12-08

## 2020-06-09 MED ORDER — FLUTICASONE-SALMETEROL 100-50 MCG/DOSE IN AEPB: 1.0000 | INHALATION_SPRAY | Freq: Two times a day (BID) | RESPIRATORY_TRACT | 2 refills | Status: DC

## 2020-06-09 MED ORDER — LISDEXAMFETAMINE DIMESYLATE 40 MG PO CAPS: 40.0000 mg | ORAL_CAPSULE | ORAL | 0 refills | Status: DC

## 2020-06-09 MED ORDER — ESCITALOPRAM OXALATE 10 MG PO TABS: 10.0000 mg | ORAL_TABLET | Freq: Every day | ORAL | 1 refills | Status: DC

## 2020-06-09 MED ORDER — LISDEXAMFETAMINE DIMESYLATE 40 MG PO CAPS: 40.0000 mg | ORAL_CAPSULE | Freq: Every day | ORAL | 0 refills | Status: DC

## 2020-06-09 MED ORDER — EMTRICITABINE-TENOFOVIR DF 200-300 MG PO TABS: 1.0000 | ORAL_TABLET | Freq: Every day | ORAL | 1 refills | Status: DC

## 2020-06-09 MED ORDER — ALBUTEROL SULFATE HFA 108 (90 BASE) MCG/ACT IN AERS: 2.0000 | INHALATION_SPRAY | Freq: Four times a day (QID) | RESPIRATORY_TRACT | 0 refills | Status: DC | PRN

## 2020-06-10 LAB — COMPLETE METABOLIC PANEL WITH GFR
AG Ratio: 2.1 (calc) (ref 1.0–2.5)
ALT: 17 U/L (ref 9–46)
AST: 22 U/L (ref 10–40)
Albumin: 5 g/dL (ref 3.6–5.1)
Alkaline phosphatase (APISO): 61 U/L (ref 36–130)
BUN: 9 mg/dL (ref 7–25)
CO2: 33 mmol/L — ABNORMAL HIGH (ref 20–32)
Calcium: 9.9 mg/dL (ref 8.6–10.3)
Chloride: 103 mmol/L (ref 98–110)
Creat: 0.81 mg/dL (ref 0.60–1.35)
GFR, Est African American: 145 mL/min/{1.73_m2} (ref >=60)
GFR, Est Non African American: 125 mL/min/{1.73_m2} (ref >=60)
Globulin: 2.4 g/dL (calc) (ref 1.9–3.7)
Glucose, Bld: 76 mg/dL (ref 65–99)
Potassium: 4.1 mmol/L (ref 3.5–5.3)
Sodium: 141 mmol/L (ref 135–146)
Total Bilirubin: 1.5 mg/dL — ABNORMAL HIGH (ref 0.2–1.2)
Total Protein: 7.4 g/dL (ref 6.1–8.1)

## 2020-06-10 LAB — LIPID PANEL
Cholesterol: 124 mg/dL (ref <200)
HDL: 44 mg/dL (ref >=40)
LDL Cholesterol (Calc): 62 mg/dL (calc)
Non-HDL Cholesterol (Calc): 80 mg/dL (calc) (ref <130)
Total CHOL/HDL Ratio: 2.8 (calc) (ref <5.0)
Triglycerides: 92 mg/dL (ref <150)

## 2020-06-10 LAB — CBC WITH DIFFERENTIAL/PLATELET
Absolute Monocytes: 276 cells/uL (ref 200–950)
Basophils Absolute: 40 cells/uL (ref 0–200)
Basophils Relative: 1 %
Eosinophils Absolute: 48 cells/uL (ref 15–500)
Eosinophils Relative: 1.2 %
HCT: 44.2 % (ref 38.5–50.0)
Hemoglobin: 15 g/dL (ref 13.2–17.1)
Lymphs Abs: 1024 cells/uL (ref 850–3900)
MCH: 32 pg (ref 27.0–33.0)
MCHC: 33.9 g/dL (ref 32.0–36.0)
MCV: 94.2 fL (ref 80.0–100.0)
MPV: 12.8 fL — ABNORMAL HIGH (ref 7.5–12.5)
Monocytes Relative: 6.9 %
Neutro Abs: 2612 cells/uL (ref 1500–7800)
Neutrophils Relative %: 65.3 %
Platelets: 176 10*3/uL (ref 140–400)
RBC: 4.69 10*6/uL (ref 4.20–5.80)
RDW: 12 % (ref 11.0–15.0)
Total Lymphocyte: 25.6 %
WBC: 4 10*3/uL (ref 3.8–10.8)

## 2020-06-10 LAB — HEPATITIS C ANTIBODY
Hepatitis C Ab: NONREACTIVE
SIGNAL TO CUT-OFF: 0.02 (ref <1.00)

## 2020-06-10 LAB — HEMOGLOBIN A1C
Hgb A1c MFr Bld: 5.3 % of total Hgb (ref <5.7)
Mean Plasma Glucose: 105 mg/dL
eAG (mmol/L): 5.8 mmol/L

## 2020-06-10 LAB — RPR: RPR Ser Ql: NONREACTIVE

## 2020-06-10 LAB — HIV ANTIBODY (ROUTINE TESTING W REFLEX): HIV 1&2 Ab, 4th Generation: NONREACTIVE

## 2020-09-02 ENCOUNTER — Telehealth: Payer: Self-pay | Admitting: Family Medicine

## 2020-09-02 NOTE — Telephone Encounter (Signed)
Pt is calling to receive his results from labs from 01/22 Please advise CB- [phone] (M)

## 2020-09-03 NOTE — Telephone Encounter (Signed)
Tried calling pt. No answer and v/m full. Unable to leave message.

## 2020-09-03 NOTE — Telephone Encounter (Signed)
Pt returned phone call. Gave all lab results and answered all questioned. Pt gave verbal understanding.

## 2020-10-13 ENCOUNTER — Encounter: Payer: Self-pay | Admitting: Family Medicine

## 2020-10-13 ENCOUNTER — Other Ambulatory Visit: Payer: Self-pay

## 2020-10-13 ENCOUNTER — Other Ambulatory Visit (HOSPITAL_COMMUNITY)
Admission: RE | Admit: 2020-10-13 | Discharge: 2020-10-13 | Disposition: A | Discharge status: Home / Self Care | Payer: Medicaid Other | Source: Ambulatory Visit | Attending: Family Medicine | Admitting: Family Medicine

## 2020-10-13 ENCOUNTER — Ambulatory Visit: Payer: Medicaid Other | Admitting: Family Medicine

## 2020-10-13 VITALS — BP 110/76 | HR 82 | Temp 98.3°F | Resp 16 | Ht 67.0 in | Wt 132.0 lb

## 2020-10-13 DIAGNOSIS — R634 Abnormal weight loss: Secondary | ICD-10-CM | POA: Diagnosis not present

## 2020-10-13 DIAGNOSIS — Z0000 Encounter for general adult medical examination without abnormal findings: Secondary | ICD-10-CM | POA: Insufficient documentation

## 2020-10-13 DIAGNOSIS — Z113 Encounter for screening for infections with a predominantly sexual mode of transmission: Secondary | ICD-10-CM | POA: Diagnosis not present

## 2020-10-13 DIAGNOSIS — Z7252 High risk homosexual behavior: Secondary | ICD-10-CM

## 2020-10-13 NOTE — Progress Notes (Signed)
Name: Benjamin Pitts   MRN: [id number]    DOB: Oct 29, 1995   Date:10/13/2020       Progress Note  Subjective  Chief Complaint  Annual Exam  HPI  Patient presents for annual CPE   Weight is up and down in 2020 it was 131 lbs, last visit 144 and today is down to 132 lbs, he states normal appetite   Diet: he states he eats daily, skips meals at times. He eats fast food at most twice a week. He eats a variety of foods. 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   Depression: phq 9 is positive Depression screen [facility] 2/9 10/13/2020 06/09/2020 11/11/2019 08/11/2019 12/18/2018  Decreased Interest 0 0 0 0 3  Down, Depressed, Hopeless 1 1 0 0 1  PHQ - 2 Score 1 1 0 0 4  Altered sleeping 0 1 0 0 2  Tired, decreased energy 1 2 0 0 1  Change in appetite 1 0 0 0 2  Feeling bad or failure about yourself  1 0 0 0 2  Trouble concentrating 0 1 0 0 1  Moving slowly or fidgety/restless 0 0 0 0 2  Suicidal thoughts 0 0 0 0 1  PHQ-9 Score 4 5 0 0 15  Difficult doing work/chores - Somewhat difficult - - Somewhat difficult    Hypertension:  BP Readings from Last 3 Encounters:  10/13/20 110/76  06/09/20 114/72  11/11/19 120/70    Obesity: Wt Readings from Last 3 Encounters:  10/13/20 132 lb (59.9 kg)  06/09/20 143 lb 8 oz (65.1 kg)  11/11/19 137 lb 12.8 oz (62.5 kg)   BMI Readings from Last 3 Encounters:  10/13/20 20.67 kg/m  06/09/20 22.48 kg/m  11/11/19 21.58 kg/m     Lipids:  Lab Results  Component Value Date   CHOL 124 06/09/2020   CHOL 122 02/24/2015   CHOL CANCELED 02/23/2015   Lab Results  Component Value Date   HDL 44 06/09/2020   Lab Results  Component Value Date   LDLCALC 62 06/09/2020   Lab Results  Component Value Date   TRIG 92 06/09/2020   Lab Results  Component Value Date   CHOLHDL 2.8 06/09/2020   No results found for:  LDLDIRECT Glucose:  Glucose  Date Value Ref Range Status  02/24/2015 102 (H) 65 - 99 mg/dL Final  32/67/1245 CANCELED mg/dL     Comment:    No serum received.  Result canceled by the ancillary    Glucose, Bld  Date Value Ref Range Status  06/09/2020 76 65 - 99 mg/dL Final    Comment:    .            Fasting reference interval .     Flowsheet Row Office Visit from 06/09/2020 in [facility]  AUDIT-C Score 0      Single STD testing and prevention (HIV/chl/gon/syphilis): 06/09/20 Hep C: 06/09/20  Skin cancer: Discussed monitoring for atypical lesions  Vaccines:  Up to date   Advanced Care Planning: A voluntary discussion about advance care planning including the explanation and discussion of advance directives.  Discussed health care proxy and Living will, and the patient was able to identify a health care proxy as grandmother - Mackey Birchwood [place]burn   Patient Active Problem List   Diagnosis Date Noted  .  Major depression, recurrent, chronic (HCC) 06/09/2020  . Eczema 02/23/2015  . ADHD (attention deficit hyperactivity disorder) 02/23/2015  . Allergic rhinitis, seasonal 02/23/2015  . Asthma, mild intermittent 02/23/2015  . Learning problem 02/23/2015    No past surgical history on file.  Family History  Problem Relation Age of Onset  . Asthma Sister   . Seizures Sister     Social History   Socioeconomic History  . Marital status: Single    Spouse name: Not on file  . Number of children: 0  . Years of education: Not on file  . Highest education level: Associate degree: academic program  Occupational History  . Occupation: unemployed  Tobacco Use  . Smoking status: Never Smoker  . Smokeless tobacco: Never Used  Vaping Use  . Vaping Use: Never used  Substance and Sexual Activity  . Alcohol use: No    Alcohol/week: 0.0 standard drinks  . Drug use: No  . Sexual activity: Yes    Partners: Male, Male    Birth control/protection: Condom     Comment: bi-curious   Other Topics Concern  . 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 Difficulty of Paying Living Expenses: Not hard at all  Food Insecurity: No Food Insecurity  . Worried About Programme researcher, broadcasting/film/video in the Last Year: Never true  . Ran Out of Food in the Last Year: Never true  Transportation Needs: No Transportation Needs  . Lack of Transportation (Medical): No  . Lack of Transportation (Non-Medical): No  Physical Activity: Insufficiently Active  . Days of Exercise per Week: 3 days  . Minutes of Exercise per Session: 30 min  Stress: No Stress Concern Present  . Feeling of Stress : Only a little  Social Connections: Moderately Integrated  . Frequency of Communication with Friends and Family: Three times a week  . Frequency of Social Gatherings with Friends and Family: Twice a week  . Attends Religious Services: More than 4 times per year  . Active Member of Clubs or Organizations: Yes  . Attends Banker Meetings: More than 4 times per year  . Marital Status: Never married  Intimate Partner Violence: Not At Risk  . Fear of Current or Ex-Partner: No  . Emotionally Abused: No  . Physically Abused: No  . Sexually Abused: No     Current Outpatient Medications:  .  albuterol (VENTOLIN HFA) 108 (90 Base) MCG/ACT inhaler, Inhale 2 puffs into the lungs every 6 (six) hours as needed for wheezing or shortness of breath., Disp: 18 g, Rfl: 0 .  buPROPion (WELLBUTRIN XL) 150 MG 24 hr tablet, Take 1 tablet (150 mg total) by mouth at bedtime., Disp: 90 tablet, Rfl: 1 .  emtricitabine-tenofovir (TRUVADA) 200-300 MG tablet, Take 1 tablet by mouth daily., Disp: 90 tablet, Rfl: 1 .  escitalopram (LEXAPRO) 10 MG tablet, Take 1 tablet (10 mg  total) by mouth daily., Disp: 90 tablet, Rfl: 1 .  Fluticasone-Salmeterol (ADVAIR) 100-50 MCG/DOSE AEPB, Inhale 1 puff into the lungs 2 (two) times daily., Disp: 1 each, Rfl: 2 .  lisdexamfetamine (VYVANSE) 40 MG capsule, Take 1 capsule (40 mg total) by mouth every morning., Disp: 30 capsule, Rfl: 0 .  lisdexamfetamine (VYVANSE) 40 MG capsule, Take 1 capsule (40 mg total) by mouth every morning., Disp: 30 capsule, Rfl: 0 .  loratadine (CLARITIN) 10 MG tablet, Take 1 tablet (10 mg total) by mouth daily., Disp: 90 tablet, Rfl: 3 .  lisdexamfetamine (VYVANSE) 40 MG capsule, Take 1 capsule (40 mg total) by mouth daily., Disp: 30 capsule, Rfl: 0  No Known Allergies   ROS  Constitutional: Negative for fever or weight change.  Respiratory: Negative for cough and shortness of breath.   Cardiovascular: Negative for chest pain or palpitations.  Gastrointestinal: Negative for abdominal pain, no bowel changes.  Musculoskeletal: Negative for gait problem or joint swelling.  Skin: Negative for rash.  Neurological: Negative for dizziness or headache.  No other specific complaints in a complete review of systems (except as listed in HPI above).  Objective  Vitals:   10/13/20 1347  BP: 110/76  Pulse: 82  Resp: 16  Temp: 98.3 F (36.8 C)  TempSrc: Oral  SpO2: 98%  Weight: 132 lb (59.9 kg)  Height: 5\' 7"  (1.702 m)    Body mass index is 20.67 kg/m.  Physical Exam  Constitutional: Patient appears well-developed and well-nourished. No distress.  HENT: Head: Normocephalic and atraumatic. Ears: B TMs ok, no erythema or effusion; Nose: Not done  Mouth/Throat: not done  Eyes: Conjunctivae and EOM are normal. Pupils are equal, round, and reactive to light. No scleral icterus.  Neck: Normal range of motion. Neck supple. No JVD present. No thyromegaly present.  Cardiovascular: Normal rate, regular rhythm and normal heart sounds.  No murmur heard. No BLE edema. Pulmonary/Chest: Effort normal and  breath sounds normal. No respiratory distress. Abdominal: Soft. Bowel sounds are normal, no distension. There is no tenderness. no masses MALE GENITALIA: Normal descended testes bilaterally, no masses palpated, no hernias, no lesions, no discharge RECTAL: not done  Musculoskeletal: Normal range of motion, no joint effusions. No gross deformities Neurological: he is alert and oriented to person, place, and time. No cranial nerve deficit. Coordination, balance, strength, speech and gait are normal.  Skin: Skin is warm and dry. No rash noted. No erythema.  Psychiatric: Patient has a normal mood and affect. behavior is normal. Judgment and thought content normal.   Fall Risk: Fall Risk  10/13/2020 06/09/2020 11/11/2019 08/11/2019 12/23/2018  Falls in the past year? 0 0 0 0 0  Number falls in past yr: 0 0 0 0 0  Injury with Fall? 0 0 0 0 0  Follow up - - - - -     Functional Status Survey: Is the patient deaf or have difficulty hearing?: No Does the patient have difficulty seeing, even when wearing glasses/contacts?: No Does the patient have difficulty concentrating, remembering, or making decisions?: No Does the patient have difficulty walking or climbing stairs?: No Does the patient have difficulty dressing or bathing?: No Does the patient have difficulty doing errands alone such as visiting a doctor's office or shopping?: No   Assessment & Plan  1. Well adult exam  - CBC with Differential/Platelet - COMPLETE METABOLIC PANEL WITH GFR - HIV Antibody (routine testing w rflx) - RPR - TSH - Urine cytology ancillary only - Cytology (oral, anal, urethral) ancillary only  2. Routine screening for STI (sexually transmitted infection)   3. Weight loss  - CBC with Differential/Platelet - COMPLETE METABOLIC PANEL WITH GFR - HIV Antibody (routine testing w rflx)  4. High risk homosexual behavior  - HIV Antibody (routine testing w rflx) - RPR - Urine  cytology ancillary only - Cytology  (oral, anal, urethral) ancillary only   -Prostate cancer screening and PSA options (with potential risks and benefits of testing vs not testing) were discussed along with recent recs/guidelines. -USPSTF grade A and B recommendations reviewed with patient; age-appropriate recommendations, preventive care, screening tests, etc discussed and encouraged; healthy living encouraged; see AVS for patient education given to patient -Discussed importance of 150 minutes of physical activity weekly, eat two servings of fish weekly, eat one serving of tree nuts ( cashews, pistachios, pecans, almonds.Marland Kitchen) every other day, eat 6 servings of fruit/vegetables daily and drink plenty of water and avoid sweet beverages.

## 2020-10-13 NOTE — Patient Instructions (Signed)
Preventive Care 21-24 Years Old, Male Preventive care refers to lifestyle choices and visits with your health care provider that can promote health and wellness. This includes:  A yearly physical exam. This is also called an annual wellness visit.  Regular dental and eye exams.  Immunizations.  Screening for certain conditions.  Healthy lifestyle choices, such as: ? Eating a healthy diet. ? Getting regular exercise. ? Not using drugs or products that contain nicotine and tobacco. ? Limiting alcohol use. What can I expect for my preventive care visit? Physical exam Your health care provider may check your:  Height and weight. These may be used to calculate your BMI (body mass index). BMI is a measurement that tells if you are at a healthy weight.  Heart rate and blood pressure.  Body temperature.  Skin for abnormal spots. Counseling Your health care provider may ask you questions about your:  Past medical problems.  Family's medical history.  Alcohol, tobacco, and drug use.  Emotional well-being.  Home life and relationship well-being.  Sexual activity.  Diet, exercise, and sleep habits.  Work and work environment.  Access to firearms. What immunizations do I need? Vaccines are usually given at various ages, according to a schedule. Your health care provider will recommend vaccines for you based on your age, medical history, and lifestyle or other factors, such as travel or where you work.   What tests do I need? Blood tests  Lipid and cholesterol levels. These may be checked every 5 years starting at age 20.  Hepatitis C test.  Hepatitis B test. Screening  Diabetes screening. This is done by checking your blood sugar (glucose) after you have not eaten for a while (fasting).  Genital exam to check for testicular cancer or hernias.  STD (sexually transmitted disease) testing, if you are at risk. Talk with your health care provider about your test results,  treatment options, and if necessary, the need for more tests.   Follow these instructions at home: Eating and drinking  Eat a healthy diet that includes fresh fruits and vegetables, whole grains, lean protein, and low-fat dairy products.  Drink enough fluid to keep your urine pale yellow.  Take vitamin and mineral supplements as recommended by your health care provider.  Do not drink alcohol if your health care provider tells you not to drink.  If you drink alcohol: ? Limit how much you have to 0-2 drinks a day. ? Be aware of how much alcohol is in your drink. In the [place] one drink equals one 12 oz bottle of beer (355 mL), one 5 oz glass of wine (148 mL), or one 1 oz glass of hard liquor (44 mL).   Lifestyle  Take daily care of your teeth and gums. Brush your teeth every morning and night with fluoride toothpaste. Floss one time each day.  Stay active. Exercise for at least 30 minutes 5 or more days each week.  Do not use any products that contain nicotine or tobacco, such as cigarettes, e-cigarettes, and chewing tobacco. If you need help quitting, ask your health care provider.  Do not use drugs.  If you are sexually active, practice safe sex. Use a condom or other form of protection to prevent STIs (sexually transmitted infections).  Find healthy ways to cope with stress, such as: ? Meditation, yoga, or listening to music. ? Journaling. ? Talking to a trusted person. ? Spending time with friends and family. Safety  Always wear your seat belt while driving   or riding in a vehicle.  Do not drive: ? If you have been drinking alcohol. Do not ride with someone who has been drinking. ? When you are tired or distracted. ? While texting.  Wear a helmet and other protective equipment during sports activities.  If you have firearms in your house, make sure you follow all gun safety procedures.  Seek help if you have been physically or sexually abused. What's next?  Go to your  health care provider once a year for an annual wellness visit.  Ask your health care provider how often you should have your eyes and teeth checked.  Stay up to date on all vaccines. This information is not intended to replace advice given to you by your health care provider. Make sure you discuss any questions you have with your health care provider. Document Revised: 01/15/2019 Document Reviewed: 04/25/2018 Elsevier Patient Education  2021 Elsevier Inc.  

## 2020-10-14 LAB — COMPLETE METABOLIC PANEL WITH GFR
AG Ratio: 2.2 (calc) (ref 1.0–2.5)
ALT: 12 U/L (ref 9–46)
AST: 17 U/L (ref 10–40)
Albumin: 5.3 g/dL — ABNORMAL HIGH (ref 3.6–5.1)
Alkaline phosphatase (APISO): 60 U/L (ref 36–130)
BUN: 14 mg/dL (ref 7–25)
CO2: 28 mmol/L (ref 20–32)
Calcium: 9.9 mg/dL (ref 8.6–10.3)
Chloride: 103 mmol/L (ref 98–110)
Creat: 0.89 mg/dL (ref 0.60–1.35)
GFR, Est African American: 139 mL/min/{1.73_m2} (ref >=60)
GFR, Est Non African American: 120 mL/min/{1.73_m2} (ref >=60)
Globulin: 2.4 g/dL (calc) (ref 1.9–3.7)
Glucose, Bld: 78 mg/dL (ref 65–99)
Potassium: 3.9 mmol/L (ref 3.5–5.3)
Sodium: 141 mmol/L (ref 135–146)
Total Bilirubin: 2.7 mg/dL — ABNORMAL HIGH (ref 0.2–1.2)
Total Protein: 7.7 g/dL (ref 6.1–8.1)

## 2020-10-14 LAB — CBC WITH DIFFERENTIAL/PLATELET
Absolute Monocytes: 401 cells/uL (ref 200–950)
Basophils Absolute: 50 cells/uL (ref 0–200)
Basophils Relative: 1.1 %
Eosinophils Absolute: 99 cells/uL (ref 15–500)
Eosinophils Relative: 2.2 %
HCT: 47.1 % (ref 38.5–50.0)
Hemoglobin: 15.4 g/dL (ref 13.2–17.1)
Lymphs Abs: 1188 cells/uL (ref 850–3900)
MCH: 30.8 pg (ref 27.0–33.0)
MCHC: 32.7 g/dL (ref 32.0–36.0)
MCV: 94.2 fL (ref 80.0–100.0)
MPV: 12.6 fL — ABNORMAL HIGH (ref 7.5–12.5)
Monocytes Relative: 8.9 %
Neutro Abs: 2763 cells/uL (ref 1500–7800)
Neutrophils Relative %: 61.4 %
Platelets: 120 10*3/uL — ABNORMAL LOW (ref 140–400)
RBC: 5 10*6/uL (ref 4.20–5.80)
RDW: 11.9 % (ref 11.0–15.0)
Total Lymphocyte: 26.4 %
WBC: 4.5 10*3/uL (ref 3.8–10.8)

## 2020-10-14 LAB — RPR: RPR Ser Ql: NONREACTIVE

## 2020-10-14 LAB — TSH: TSH: 1.21 mIU/L (ref 0.40–4.50)

## 2020-10-14 LAB — HIV ANTIBODY (ROUTINE TESTING W REFLEX): HIV 1&2 Ab, 4th Generation: NONREACTIVE

## 2020-10-15 ENCOUNTER — Other Ambulatory Visit: Payer: Self-pay | Admitting: Family Medicine

## 2020-10-15 DIAGNOSIS — D696 Thrombocytopenia, unspecified: Secondary | ICD-10-CM

## 2020-10-15 LAB — URINE CYTOLOGY ANCILLARY ONLY
Chlamydia: NEGATIVE
Comment: NEGATIVE
Comment: NORMAL
Neisseria Gonorrhea: NEGATIVE

## 2020-10-15 LAB — CYTOLOGY, (ORAL, ANAL, URETHRAL) ANCILLARY ONLY
Chlamydia: NEGATIVE
Comment: NEGATIVE
Comment: NORMAL
Neisseria Gonorrhea: NEGATIVE

## 2020-11-11 ENCOUNTER — Ambulatory Visit: Payer: Medicaid Other | Admitting: Family Medicine

## 2020-11-11 NOTE — Progress Notes (Deleted)
Name: Benjamin Pitts   MRN: [id number]    DOB: 09/03/1995   Date:11/11/2020       Progress Note  Subjective  Chief Complaint  Follow Up  HPI  ADD with learning disability:  He has a part time job, he has difficulty managing his money, also difficulty with focus and is trying to get a full time job. He would like a refill of Vyvanse. He is asking his grandmother to help him with budgeting and writing goals down so he can follow through and try to move out of his grandmother's house.    AR: he still has episodes of nasal congestion and sneezing but occasionally. He takes loratadine prn    Asthma: he states over the past 4 days he has noticed some sob and wheezing, he has been using inhaler prn . No cough or cold symptoms    MDD: he did not go see Dr. Elna Breslow, states he had car problems and did not know where it was. He has been feeling better, he has been more mindful, phq 9 is positive. He would like refills of Lexapro and wellbutrin   History of STI/high risk : he states not sexually active lately, he has condoms, he is bisexual, he would like a refill of truvada. Check for STI today    Patient Active Problem List   Diagnosis Date Noted   Major depression, recurrent, chronic (HCC) 06/09/2020   Eczema 02/23/2015   ADHD (attention deficit hyperactivity disorder) 02/23/2015   Allergic rhinitis, seasonal 02/23/2015   Asthma, mild intermittent 02/23/2015   Learning problem 02/23/2015    No past surgical history on file.  Family History  Problem Relation Age of Onset   Asthma Sister    Seizures Sister     Social History   Tobacco Use   Smoking status: Never   Smokeless tobacco: Never  Substance Use Topics   Alcohol use: No    Alcohol/week: 0.0 standard drinks     Current Outpatient Medications:    albuterol (VENTOLIN HFA) 108 (90 Base) MCG/ACT inhaler, Inhale 2 puffs into the lungs every 6 (six) hours as needed for wheezing or shortness of breath., Disp: 18 g, Rfl: 0    buPROPion (WELLBUTRIN XL) 150 MG 24 hr tablet, Take 1 tablet (150 mg total) by mouth at bedtime., Disp: 90 tablet, Rfl: 1   emtricitabine-tenofovir (TRUVADA) 200-300 MG tablet, Take 1 tablet by mouth daily., Disp: 90 tablet, Rfl: 1   escitalopram (LEXAPRO) 10 MG tablet, Take 1 tablet (10 mg total) by mouth daily., Disp: 90 tablet, Rfl: 1   Fluticasone-Salmeterol (ADVAIR) 100-50 MCG/DOSE AEPB, Inhale 1 puff into the lungs 2 (two) times daily., Disp: 1 each, Rfl: 2   lisdexamfetamine (VYVANSE) 40 MG capsule, Take 1 capsule (40 mg total) by mouth daily., Disp: 30 capsule, Rfl: 0   lisdexamfetamine (VYVANSE) 40 MG capsule, Take 1 capsule (40 mg total) by mouth every morning., Disp: 30 capsule, Rfl: 0   lisdexamfetamine (VYVANSE) 40 MG capsule, Take 1 capsule (40 mg total) by mouth every morning., Disp: 30 capsule, Rfl: 0   loratadine (CLARITIN) 10 MG tablet, Take 1 tablet (10 mg total) by mouth daily., Disp: 90 tablet, Rfl: 3  No Known Allergies  I personally reviewed {Reviewed:14835} with the patient/caregiver today.   ROS  ***  Objective  There were no vitals filed for this visit.  There is no height or weight on file to calculate BMI.  Physical Exam ***  Recent Results (from the  past 2160 hour(s))  CBC with Differential/Platelet     Status: Abnormal   Collection Time: 10/13/20 12:00 AM  Result Value Ref Range   WBC 4.5 3.8 - 10.8 Thousand/uL   RBC 5.00 4.20 - 5.80 Million/uL   Hemoglobin 15.4 13.2 - 17.1 g/dL   HCT 16.0 10.9 - 32.3 %   MCV 94.2 80.0 - 100.0 fL   MCH 30.8 27.0 - 33.0 pg   MCHC 32.7 32.0 - 36.0 g/dL   RDW 55.7 32.2 - 02.5 %   Platelets 120 (L) 140 - 400 Thousand/uL   MPV 12.6 (H) 7.5 - 12.5 fL   Neutro Abs 2,763 1,500 - 7,800 cells/uL   Lymphs Abs 1,188 850 - 3,900 cells/uL   Absolute Monocytes 401 200 - 950 cells/uL   Eosinophils Absolute 99 15 - 500 cells/uL   Basophils Absolute 50 0 - 200 cells/uL   Neutrophils Relative % 61.4 %   Total Lymphocyte 26.4  %   Monocytes Relative 8.9 %   Eosinophils Relative 2.2 %   Basophils Relative 1.1 %   Smear Review      Comment: Platelet clumps noted on smear-count appears adequate. Review of peripheral smear confirms automated results.   COMPLETE METABOLIC PANEL WITH GFR     Status: Abnormal   Collection Time: 10/13/20 12:00 AM  Result Value Ref Range   Glucose, Bld 78 65 - 99 mg/dL    Comment: .            Fasting reference interval .    BUN 14 7 - 25 mg/dL   Creat 4.27 0.62 - 3.76 mg/dL   GFR, Est Non African American 120 > OR = 60 mL/min/1.16m2   GFR, Est African American 139 > OR = 60 mL/min/1.93m2   BUN/Creatinine Ratio NOT APPLICABLE 6 - 22 (calc)   Sodium 141 135 - 146 mmol/L   Potassium 3.9 3.5 - 5.3 mmol/L   Chloride 103 98 - 110 mmol/L   CO2 28 20 - 32 mmol/L   Calcium 9.9 8.6 - 10.3 mg/dL   Total Protein 7.7 6.1 - 8.1 g/dL   Albumin 5.3 (H) 3.6 - 5.1 g/dL   Globulin 2.4 1.9 - 3.7 g/dL (calc)   AG Ratio 2.2 1.0 - 2.5 (calc)   Total Bilirubin 2.7 (H) 0.2 - 1.2 mg/dL   Alkaline phosphatase (APISO) 60 36 - 130 U/L   AST 17 10 - 40 U/L   ALT 12 9 - 46 U/L  HIV Antibody (routine testing w rflx)     Status: None   Collection Time: 10/13/20 12:00 AM  Result Value Ref Range   HIV 1&2 Ab, 4th Generation NON-REACTIVE NON-REACTIVE    Comment: HIV-1 antigen and HIV-1/HIV-2 antibodies were not detected. There is no laboratory evidence of HIV infection. Marland Kitchen PLEASE NOTE: This information has been disclosed to you from records whose confidentiality may be protected by state law.  If your state requires such protection, then the state law prohibits you from making any further disclosure of the information without the specific written consent of the person to whom it pertains, or as otherwise permitted by law. A general authorization for the release of medical or other information is NOT sufficient for this purpose. . For additional information please refer  to http://education.questdiagnostics.com/faq/FAQ106 (This link is being provided for informational/ educational purposes only.) . Marland Kitchen The performance of this assay has not been clinically validated in patients less than 85 years old. Marland Kitchen   RPR  Status: None   Collection Time: 10/13/20 12:00 AM  Result Value Ref Range   RPR Ser Ql NON-REACTIVE NON-REACTIVE  TSH     Status: None   Collection Time: 10/13/20 12:00 AM  Result Value Ref Range   TSH 1.21 0.40 - 4.50 mIU/L  Urine cytology ancillary only     Status: None   Collection Time: 10/13/20  2:15 PM  Result Value Ref Range   Neisseria Gonorrhea Negative    Chlamydia Negative    Comment Normal Reference Ranger Chlamydia - Negative    Comment      Normal Reference Range Neisseria Gonorrhea - Negative  Cytology (oral, anal, urethral) ancillary only     Status: None   Collection Time: 10/13/20  2:15 PM  Result Value Ref Range   Neisseria Gonorrhea Negative    Chlamydia Negative    Comment Normal Reference Ranger Chlamydia - Negative    Comment      Normal Reference Range Neisseria Gonorrhea - Negative    Diabetic Foot Exam: Diabetic Foot Exam - Simple   No data filed    ***  PHQ2/9: Depression screen [facility] 2/9 10/13/2020 06/09/2020 11/11/2019 08/11/2019 12/18/2018  Decreased Interest 0 0 0 0 3  Down, Depressed, Hopeless 1 1 0 0 1  PHQ - 2 Score 1 1 0 0 4  Altered sleeping 0 1 0 0 2  Tired, decreased energy 1 2 0 0 1  Change in appetite 1 0 0 0 2  Feeling bad or failure about yourself  1 0 0 0 2  Trouble concentrating 0 1 0 0 1  Moving slowly or fidgety/restless 0 0 0 0 2  Suicidal thoughts 0 0 0 0 1  PHQ-9 Score 4 5 0 0 15  Difficult doing work/chores - Somewhat difficult - - Somewhat difficult    phq 9 is {gen pos TLX:[id number]} ***  Fall Risk: Fall Risk  10/13/2020 06/09/2020 11/11/2019 08/11/2019 12/23/2018  Falls in the past year? 0 0 0 0 0  Number falls in past yr: 0 0 0 0 0  Injury with Fall? 0 0 0 0 0  Follow up - - -  - -   ***   Functional Status Survey:   ***   Assessment & Plan  *** There are no diagnoses linked to this encounter.

## 2020-11-22 ENCOUNTER — Ambulatory Visit: Payer: Medicaid Other | Admitting: Family Medicine

## 2020-12-06 NOTE — Progress Notes (Signed)
Name: Benjamin Pitts   MRN: [id number]    DOB: Sep 25, 1995   Date:12/08/2020       Progress Note  Subjective  Chief Complaint  Follow Up  HPI  ADD with learning disability: He is no longer going [facility], he stopped going because he did not like the school environment . Currently working part time jobs, taking Vyvanse and helps with focus but it starts to wear off around 2 pm , offered to go up on the dose but he wants to continue 40 mg for now  . Explained he can take it later in the day if he has a job in the evening    AR: he states doing well this time of the year, usually worse in the Fall .  He takes loratadine prn    Asthma: He denies cough, sob or wheezing , but last visit he was wheezing during exam and gave him Advair, he is only taking it prn, advised to use at least once a day and reminded him medication expires if opened for 60 days.    MDD: he did not go see Dr. Elna BreslowEappen, states he had car problems and did not know where it was. He has been feeling better, phq 9 is positive. He states Lexapro and wellbutrin have been helpful, phq 9 is 4.   History of STI/high risk : he states not sexually active lately, he has condoms, he is bisexual, he has bene taking Truvada and has been compliant , he wants to be rechecked for STI  Malnutrition: he was 243 lbs in January and today is down to 122 lbs. He lost 21 lbs in the past 6 months and 10 lbs in the past month, he is not trying to lose weight, he states taking vyvanse daily for the past few weeks. Discussed high calorie diet, we will check labs and if continues to lose weight we will have to stop vyvanse or decrease dose   Low platelets: we will recheck levels today, also had high protein and we will check SPEP    Patient Active Problem List   Diagnosis Date Noted   Major depression, recurrent, chronic (HCC) 06/09/2020   Eczema 02/23/2015   ADHD (attention deficit hyperactivity disorder) 02/23/2015   Allergic rhinitis, seasonal  02/23/2015   Asthma, mild intermittent 02/23/2015   Learning problem 02/23/2015    No past surgical history on file.  Family History  Problem Relation Age of Onset   Asthma Sister    Seizures Sister     Social History   Tobacco Use   Smoking status: Never   Smokeless tobacco: Never  Substance Use Topics   Alcohol use: No    Alcohol/week: 0.0 standard drinks     Current Outpatient Medications:    albuterol (VENTOLIN HFA) 108 (90 Base) MCG/ACT inhaler, Inhale 2 puffs into the lungs every 6 (six) hours as needed for wheezing or shortness of breath., Disp: 18 g, Rfl: 0   buPROPion (WELLBUTRIN XL) 150 MG 24 hr tablet, Take 1 tablet (150 mg total) by mouth at bedtime., Disp: 90 tablet, Rfl: 1   emtricitabine-tenofovir (TRUVADA) 200-300 MG tablet, Take 1 tablet by mouth daily., Disp: 90 tablet, Rfl: 1   escitalopram (LEXAPRO) 10 MG tablet, Take 1 tablet (10 mg total) by mouth daily., Disp: 90 tablet, Rfl: 1   Fluticasone-Salmeterol (ADVAIR) 100-50 MCG/DOSE AEPB, Inhale 1 puff into the lungs 2 (two) times daily., Disp: 1 each, Rfl: 2   lisdexamfetamine (VYVANSE) 40 MG  capsule, Take 1 capsule (40 mg total) by mouth every morning., Disp: 30 capsule, Rfl: 0   lisdexamfetamine (VYVANSE) 40 MG capsule, Take 1 capsule (40 mg total) by mouth every morning., Disp: 30 capsule, Rfl: 0   loratadine (CLARITIN) 10 MG tablet, Take 1 tablet (10 mg total) by mouth daily., Disp: 90 tablet, Rfl: 3   lisdexamfetamine (VYVANSE) 40 MG capsule, Take 1 capsule (40 mg total) by mouth daily., Disp: 30 capsule, Rfl: 0  No Known Allergies  I personally reviewed active problem list, medication list, allergies, family history, social history, health maintenance with the patient/caregiver today.   ROS  Constitutional: Negative for fever , positive for weight change.  Respiratory: Negative for cough and shortness of breath.   Cardiovascular: Negative for chest pain or palpitations.  Gastrointestinal: Negative  for abdominal pain, no bowel changes.  Musculoskeletal: Negative for gait problem or joint swelling.  Skin: Negative for rash.  Neurological: Negative for dizziness or headache.  No other specific complaints in a complete review of systems (except as listed in HPI above).   Objective  Vitals:   12/08/20 1423  BP: 110/72  Pulse: 70  Resp: 16  Temp: 98.2 F (36.8 C)  SpO2: 97%  Weight: 122 lb (55.3 kg)  Height: 5\' 7"  (1.702 m)    Body mass index is 19.11 kg/m.  Physical Exam  Constitutional: Patient appears well-developed and well-nourished. No distress.  HEENT: head atraumatic, normocephalic, pupils equal and reactive to light, neck supple Cardiovascular: Normal rate, regular rhythm and normal heart sounds.  No murmur heard. No BLE edema. Pulmonary/Chest: Effort normal and breath sounds normal. No respiratory distress. Abdominal: Soft.  There is no tenderness. Psychiatric: Patient has a normal mood and affect. behavior is normal. Judgment and thought content normal.   Recent Results (from the past 2160 hour(s))  CBC with Differential/Platelet     Status: Abnormal   Collection Time: 10/13/20 12:00 AM  Result Value Ref Range   WBC 4.5 3.8 - 10.8 Thousand/uL   RBC 5.00 4.20 - 5.80 Million/uL   Hemoglobin 15.4 13.2 - 17.1 g/dL   HCT 12/13/20 16.0 - 10.9 %   MCV 94.2 80.0 - 100.0 fL   MCH 30.8 27.0 - 33.0 pg   MCHC 32.7 32.0 - 36.0 g/dL   RDW 32.3 55.7 - 32.2 %   Platelets 120 (L) 140 - 400 Thousand/uL   MPV 12.6 (H) 7.5 - 12.5 fL   Neutro Abs 2,763 1,500 - 7,800 cells/uL   Lymphs Abs 1,188 850 - 3,900 cells/uL   Absolute Monocytes 401 200 - 950 cells/uL   Eosinophils Absolute 99 15 - 500 cells/uL   Basophils Absolute 50 0 - 200 cells/uL   Neutrophils Relative % 61.4 %   Total Lymphocyte 26.4 %   Monocytes Relative 8.9 %   Eosinophils Relative 2.2 %   Basophils Relative 1.1 %   Smear Review      Comment: Platelet clumps noted on smear-count appears adequate. Review of  peripheral smear confirms automated results.   COMPLETE METABOLIC PANEL WITH GFR     Status: Abnormal   Collection Time: 10/13/20 12:00 AM  Result Value Ref Range   Glucose, Bld 78 65 - 99 mg/dL    Comment: .            Fasting reference interval .    BUN 14 7 - 25 mg/dL   Creat 12/13/20 4.27 - 0.62 mg/dL   GFR, Est Non African American 120 > OR =  60 mL/min/1.40m2   GFR, Est African American 139 > OR = 60 mL/min/1.87m2   BUN/Creatinine Ratio NOT APPLICABLE 6 - 22 (calc)   Sodium 141 135 - 146 mmol/L   Potassium 3.9 3.5 - 5.3 mmol/L   Chloride 103 98 - 110 mmol/L   CO2 28 20 - 32 mmol/L   Calcium 9.9 8.6 - 10.3 mg/dL   Total Protein 7.7 6.1 - 8.1 g/dL   Albumin 5.3 (H) 3.6 - 5.1 g/dL   Globulin 2.4 1.9 - 3.7 g/dL (calc)   AG Ratio 2.2 1.0 - 2.5 (calc)   Total Bilirubin 2.7 (H) 0.2 - 1.2 mg/dL   Alkaline phosphatase (APISO) 60 36 - 130 U/L   AST 17 10 - 40 U/L   ALT 12 9 - 46 U/L  HIV Antibody (routine testing w rflx)     Status: None   Collection Time: 10/13/20 12:00 AM  Result Value Ref Range   HIV 1&2 Ab, 4th Generation NON-REACTIVE NON-REACTIVE    Comment: HIV-1 antigen and HIV-1/HIV-2 antibodies were not detected. There is no laboratory evidence of HIV infection. Marland Kitchen PLEASE NOTE: This information has been disclosed to you from records whose confidentiality may be protected by state law.  If your state requires such protection, then the state law prohibits you from making any further disclosure of the information without the specific written consent of the person to whom it pertains, or as otherwise permitted by law. A general authorization for the release of medical or other information is NOT sufficient for this purpose. . For additional information please refer to http://education.[facility].com/faq/FAQ106 (This link is being provided for informational/ educational purposes only.) . Marland Kitchen The performance of this assay has not been clinically validated in  patients less than 56 years old. .   RPR     Status: None   Collection Time: 10/13/20 12:00 AM  Result Value Ref Range   RPR Ser Ql NON-REACTIVE NON-REACTIVE  TSH     Status: None   Collection Time: 10/13/20 12:00 AM  Result Value Ref Range   TSH 1.21 0.40 - 4.50 mIU/L  Urine cytology ancillary only     Status: None   Collection Time: 10/13/20  2:15 PM  Result Value Ref Range   Neisseria Gonorrhea Negative    Chlamydia Negative    Comment Normal Reference Ranger Chlamydia - Negative    Comment      Normal Reference Range Neisseria Gonorrhea - Negative  Cytology (oral, anal, urethral) ancillary only     Status: None   Collection Time: 10/13/20  2:15 PM  Result Value Ref Range   Neisseria Gonorrhea Negative    Chlamydia Negative    Comment Normal Reference Ranger Chlamydia - Negative    Comment      Normal Reference Range Neisseria Gonorrhea - Negative     PHQ2/9: Depression screen [facility] 2/9 12/08/2020 10/13/2020 06/09/2020 11/11/2019 08/11/2019  Decreased Interest 0 0 0 0 0  Down, Depressed, Hopeless 1 1 1  0 0  PHQ - 2 Score 1 1 1  0 0  Altered sleeping 0 0 1 0 0  Tired, decreased energy 0 1 2 0 0  Change in appetite 1 1 0 0 0  Feeling bad or failure about yourself  1 1 0 0 0  Trouble concentrating 0 0 1 0 0  Moving slowly or fidgety/restless 1 0 0 0 0  Suicidal thoughts 0 0 0 0 0  PHQ-9 Score 4 4 5  0 0  Difficult doing  work/chores - - Somewhat difficult - -    phq 9 is negative   Fall Risk: Fall Risk  12/08/2020 10/13/2020 06/09/2020 11/11/2019 08/11/2019  Falls in the past year? 0 0 0 0 0  Number falls in past yr: 0 0 0 0 0  Injury with Fall? 0 0 0 0 0  Follow up - - - - -      Functional Status Survey: Is the patient deaf or have difficulty hearing?: No Does the patient have difficulty seeing, even when wearing glasses/contacts?: No Does the patient have difficulty concentrating, remembering, or making decisions?: No Does the patient have difficulty walking or climbing  stairs?: No Does the patient have difficulty dressing or bathing?: No Does the patient have difficulty doing errands alone such as visiting a doctor's office or shopping?: No   Assessment & Plan  1. Attention-deficit hyperactivity disorder, predominantly hyperactive type  - lisdexamfetamine (VYVANSE) 40 MG capsule; Take 1 capsule (40 mg total) by mouth every morning.  Dispense: 30 capsule; Refill: 0 - lisdexamfetamine (VYVANSE) 40 MG capsule; Take 1 capsule (40 mg total) by mouth daily.  Dispense: 30 capsule; Refill: 0 - lisdexamfetamine (VYVANSE) 40 MG capsule; Take 1 capsule (40 mg total) by mouth every morning.  Dispense: 30 capsule; Refill: 0  2. Major depression, recurrent, chronic (HCC)  - escitalopram (LEXAPRO) 10 MG tablet; Take 1 tablet (10 mg total) by mouth daily.  Dispense: 90 tablet; Refill: 1 - buPROPion (WELLBUTRIN XL) 150 MG 24 hr tablet; Take 1 tablet (150 mg total) by mouth at bedtime.  Dispense: 90 tablet; Refill: 1  3. Asthma, moderate persistent, well-controlled  - fluticasone-salmeterol (ADVAIR) 100-50 MCG/ACT AEPB; Inhale 1 puff into the lungs 2 (two) times daily.  Dispense: 1 each; Refill: 5  4. Routine screening for STI (sexually transmitted infection)  - emtricitabine-tenofovir (TRUVADA) 200-300 MG tablet; Take 1 tablet by mouth daily.  Dispense: 90 tablet; Refill: 1 - Cytology (oral, anal, urethral) ancillary only - Cytology (oral, anal, urethral) ancillary only - Cytology (oral, anal, urethral) ancillary only - RPR - HIV Antibody (routine testing w rflx)  5. High risk bisexual behavior  - emtricitabine-tenofovir (TRUVADA) 200-300 MG tablet; Take 1 tablet by mouth daily.  Dispense: 90 tablet; Refill: 1  6. Mild intermittent asthma with acute exacerbation  - albuterol (VENTOLIN HFA) 108 (90 Base) MCG/ACT inhaler; Inhale 2 puffs into the lungs every 6 (six) hours as needed for wheezing or shortness of breath.  Dispense: 18 g; Refill: 0  7.  Thrombocytopenia (HCC)  - CBC with Differential/Platelet  8. Weight loss  - TSH  9. Moderate protein-calorie malnutrition (HCC)  Lost over 18 lbs in the past 6 months and 10 lbs since last month, he states not changing his diet, discussed high calorie intake, unlikely to be vyvanse but possible   10. Elevated total protein  - Protein Electrophoresis, (serum)

## 2020-12-08 ENCOUNTER — Encounter: Payer: Self-pay | Admitting: Family Medicine

## 2020-12-08 ENCOUNTER — Other Ambulatory Visit (HOSPITAL_COMMUNITY)
Admission: RE | Admit: 2020-12-08 | Discharge: 2020-12-08 | Disposition: A | Discharge status: Home / Self Care | Payer: Medicaid Other | Source: Ambulatory Visit | Attending: Family Medicine | Admitting: Family Medicine

## 2020-12-08 ENCOUNTER — Other Ambulatory Visit: Payer: Self-pay

## 2020-12-08 ENCOUNTER — Ambulatory Visit: Payer: Medicaid Other | Admitting: Family Medicine

## 2020-12-08 VITALS — BP 110/72 | HR 70 | Temp 98.2°F | Resp 16 | Ht 67.0 in | Wt 122.0 lb

## 2020-12-08 DIAGNOSIS — R778 Other specified abnormalities of plasma proteins: Secondary | ICD-10-CM | POA: Diagnosis not present

## 2020-12-08 DIAGNOSIS — F901 Attention-deficit hyperactivity disorder, predominantly hyperactive type: Secondary | ICD-10-CM

## 2020-12-08 DIAGNOSIS — E440 Moderate protein-calorie malnutrition: Secondary | ICD-10-CM

## 2020-12-08 DIAGNOSIS — F339 Major depressive disorder, recurrent, unspecified: Secondary | ICD-10-CM

## 2020-12-08 DIAGNOSIS — D696 Thrombocytopenia, unspecified: Secondary | ICD-10-CM | POA: Diagnosis not present

## 2020-12-08 DIAGNOSIS — J4540 Moderate persistent asthma, uncomplicated: Secondary | ICD-10-CM | POA: Diagnosis not present

## 2020-12-08 DIAGNOSIS — Z113 Encounter for screening for infections with a predominantly sexual mode of transmission: Secondary | ICD-10-CM | POA: Diagnosis not present

## 2020-12-08 DIAGNOSIS — Z7253 High risk bisexual behavior: Secondary | ICD-10-CM

## 2020-12-08 DIAGNOSIS — R634 Abnormal weight loss: Secondary | ICD-10-CM | POA: Diagnosis not present

## 2020-12-08 DIAGNOSIS — J4521 Mild intermittent asthma with (acute) exacerbation: Secondary | ICD-10-CM

## 2020-12-08 MED ORDER — FLUTICASONE-SALMETEROL 100-50 MCG/ACT IN AEPB: 1.0000 | INHALATION_SPRAY | Freq: Two times a day (BID) | RESPIRATORY_TRACT | 5 refills | Status: DC

## 2020-12-08 MED ORDER — EMTRICITABINE-TENOFOVIR DF 200-300 MG PO TABS: 1.0000 | ORAL_TABLET | Freq: Every day | ORAL | 1 refills | Status: DC

## 2020-12-08 MED ORDER — LISDEXAMFETAMINE DIMESYLATE 40 MG PO CAPS: 40.0000 mg | ORAL_CAPSULE | ORAL | 0 refills | Status: DC

## 2020-12-08 MED ORDER — LISDEXAMFETAMINE DIMESYLATE 40 MG PO CAPS: 40.0000 mg | ORAL_CAPSULE | Freq: Every day | ORAL | 0 refills | Status: DC

## 2020-12-08 MED ORDER — BUPROPION HCL ER (XL) 150 MG PO TB24: 150.0000 mg | ORAL_TABLET | Freq: Every day | ORAL | 1 refills | Status: DC

## 2020-12-08 MED ORDER — ESCITALOPRAM OXALATE 10 MG PO TABS: 10.0000 mg | ORAL_TABLET | Freq: Every day | ORAL | 1 refills | Status: DC

## 2020-12-08 MED ORDER — ALBUTEROL SULFATE HFA 108 (90 BASE) MCG/ACT IN AERS: 2.0000 | INHALATION_SPRAY | Freq: Four times a day (QID) | RESPIRATORY_TRACT | 0 refills | Status: DC | PRN

## 2020-12-10 LAB — CBC WITH DIFFERENTIAL/PLATELET
Absolute Monocytes: 312 cells/uL (ref 200–950)
Basophils Absolute: 38 cells/uL (ref 0–200)
Basophils Relative: 0.8 %
Eosinophils Absolute: 110 cells/uL (ref 15–500)
Eosinophils Relative: 2.3 %
HCT: 42.5 % (ref 38.5–50.0)
Hemoglobin: 13.9 g/dL (ref 13.2–17.1)
Lymphs Abs: 1070 cells/uL (ref 850–3900)
MCH: 31 pg (ref 27.0–33.0)
MCHC: 32.7 g/dL (ref 32.0–36.0)
MCV: 94.7 fL (ref 80.0–100.0)
MPV: 12.7 fL — ABNORMAL HIGH (ref 7.5–12.5)
Monocytes Relative: 6.5 %
Neutro Abs: 3269 cells/uL (ref 1500–7800)
Neutrophils Relative %: 68.1 %
Platelets: 114 10*3/uL — ABNORMAL LOW (ref 140–400)
RBC: 4.49 10*6/uL (ref 4.20–5.80)
RDW: 12 % (ref 11.0–15.0)
Total Lymphocyte: 22.3 %
WBC: 4.8 10*3/uL (ref 3.8–10.8)

## 2020-12-10 LAB — CYTOLOGY, (ORAL, ANAL, URETHRAL) ANCILLARY ONLY
Chlamydia: NEGATIVE
Chlamydia: NEGATIVE
Chlamydia: NEGATIVE
Comment: NEGATIVE
Comment: NEGATIVE
Comment: NEGATIVE
Comment: NORMAL
Comment: NORMAL
Comment: NORMAL
Neisseria Gonorrhea: NEGATIVE
Neisseria Gonorrhea: NEGATIVE
Neisseria Gonorrhea: NEGATIVE

## 2020-12-10 LAB — PROTEIN ELECTROPHORESIS, SERUM
Albumin ELP: 4.7 g/dL (ref 3.8–4.8)
Alpha 1: 0.2 g/dL (ref 0.2–0.3)
Alpha 2: 0.5 g/dL (ref 0.5–0.9)
Beta 2: 0.3 g/dL (ref 0.2–0.5)
Beta Globulin: 0.3 g/dL — ABNORMAL LOW (ref 0.4–0.6)
Gamma Globulin: 1.2 g/dL (ref 0.8–1.7)
Total Protein: 7.2 g/dL (ref 6.1–8.1)

## 2020-12-10 LAB — TSH: TSH: 1.45 mIU/L (ref 0.40–4.50)

## 2020-12-10 LAB — HIV ANTIBODY (ROUTINE TESTING W REFLEX): HIV 1&2 Ab, 4th Generation: NONREACTIVE

## 2020-12-10 LAB — RPR: RPR Ser Ql: NONREACTIVE

## 2020-12-13 ENCOUNTER — Other Ambulatory Visit: Payer: Self-pay

## 2020-12-13 DIAGNOSIS — R634 Abnormal weight loss: Secondary | ICD-10-CM

## 2020-12-13 DIAGNOSIS — D696 Thrombocytopenia, unspecified: Secondary | ICD-10-CM

## 2020-12-17 ENCOUNTER — Telehealth: Payer: Self-pay

## 2020-12-17 NOTE — Progress Notes (Signed)
Name: Benjamin Pitts   MRN: [id number]    DOB: 08/14/1995   Date:12/20/2020       Progress Note  Subjective  Chief Complaint  Discuss Hematology Referral  HPI  Thrombocytopenia: two levels below 150, he came in with his grandmother today to discuss results. Explained HCT normal but almost 2 points lower than last visit. He was losing a lot of weight, but since last visit his grandmother is reminding him and " pushing " him to eat and he gained 9 lbs since last visit  Patient Active Problem List   Diagnosis Date Noted   Thrombocytopenia (HCC) 12/08/2020   Major depression, recurrent, chronic (HCC) 06/09/2020   Eczema 02/23/2015   ADHD (attention deficit hyperactivity disorder) 02/23/2015   Allergic rhinitis, seasonal 02/23/2015   Asthma, mild intermittent 02/23/2015   Learning problem 02/23/2015    No past surgical history on file.  Family History  Problem Relation Age of Onset   Asthma Sister    Seizures Sister     Social History   Tobacco Use   Smoking status: Never   Smokeless tobacco: Never  Substance Use Topics   Alcohol use: No    Alcohol/week: 0.0 standard drinks     Current Outpatient Medications:    albuterol (VENTOLIN HFA) 108 (90 Base) MCG/ACT inhaler, Inhale 2 puffs into the lungs every 6 (six) hours as needed for wheezing or shortness of breath., Disp: 18 g, Rfl: 0   buPROPion (WELLBUTRIN XL) 150 MG 24 hr tablet, Take 1 tablet (150 mg total) by mouth at bedtime., Disp: 90 tablet, Rfl: 1   emtricitabine-tenofovir (TRUVADA) 200-300 MG tablet, Take 1 tablet by mouth daily., Disp: 90 tablet, Rfl: 1   escitalopram (LEXAPRO) 10 MG tablet, Take 1 tablet (10 mg total) by mouth daily., Disp: 90 tablet, Rfl: 1   fluticasone-salmeterol (ADVAIR) 100-50 MCG/ACT AEPB, Inhale 1 puff into the lungs 2 (two) times daily., Disp: 1 each, Rfl: 5   lisdexamfetamine (VYVANSE) 40 MG capsule, Take 1 capsule (40 mg total) by mouth every morning., Disp: 30 capsule, Rfl: 0    lisdexamfetamine (VYVANSE) 40 MG capsule, Take 1 capsule (40 mg total) by mouth daily., Disp: 30 capsule, Rfl: 0   lisdexamfetamine (VYVANSE) 40 MG capsule, Take 1 capsule (40 mg total) by mouth every morning., Disp: 30 capsule, Rfl: 0   loratadine (CLARITIN) 10 MG tablet, Take 1 tablet (10 mg total) by mouth daily., Disp: 90 tablet, Rfl: 3  No Known Allergies  I personally reviewed active problem list, medication list, allergies, family history, social history, health maintenance with the patient/caregiver today.   ROS  Constitutional: Negative for fever , positive  weight change.  Respiratory: Negative for cough and shortness of breath.   Cardiovascular: Negative for chest pain or palpitations.  Gastrointestinal: Negative for abdominal pain, no bowel changes.  Musculoskeletal: Negative for gait problem or joint swelling.  Skin: Negative for rash.  Neurological: Negative for dizziness or headache.  No other specific complaints in a complete review of systems (except as listed in HPI above).   Objective  Vitals:   12/20/20 1428  BP: 118/80  Pulse: 61  Resp: 16  Temp: 97.6 F (36.4 C)  SpO2: 98%  Weight: 131 lb (59.4 kg)  Height: 5\' 8"  (1.727 m)    Body mass index is 19.92 kg/m.  Physical Exam  Constitutional: Patient appears well-developed and well-nourished.  No distress.  HEENT: head atraumatic, normocephalic, pupils equal and reactive to light, neck supple Cardiovascular: Normal  rate, regular rhythm and normal heart sounds.  No murmur heard. No BLE edema. Pulmonary/Chest: Effort normal and breath sounds normal. No respiratory distress. Abdominal: Soft.  There is no tenderness. Psychiatric: Patient has a normal mood and affect. behavior is normal. Judgment and thought content normal.   Recent Results (from the past 2160 hour(s))  CBC with Differential/Platelet     Status: Abnormal   Collection Time: 10/13/20 12:00 AM  Result Value Ref Range   WBC 4.5 3.8 - 10.8  Thousand/uL   RBC 5.00 4.20 - 5.80 Million/uL   Hemoglobin 15.4 13.2 - 17.1 g/dL   HCT 11.9 14.7 - 82.9 %   MCV 94.2 80.0 - 100.0 fL   MCH 30.8 27.0 - 33.0 pg   MCHC 32.7 32.0 - 36.0 g/dL   RDW 56.2 13.0 - 86.5 %   Platelets 120 (L) 140 - 400 Thousand/uL   MPV 12.6 (H) 7.5 - 12.5 fL   Neutro Abs 2,763 1,500 - 7,800 cells/uL   Lymphs Abs 1,188 850 - 3,900 cells/uL   Absolute Monocytes 401 200 - 950 cells/uL   Eosinophils Absolute 99 15 - 500 cells/uL   Basophils Absolute 50 0 - 200 cells/uL   Neutrophils Relative % 61.4 %   Total Lymphocyte 26.4 %   Monocytes Relative 8.9 %   Eosinophils Relative 2.2 %   Basophils Relative 1.1 %   Smear Review      Comment: Platelet clumps noted on smear-count appears adequate. Review of peripheral smear confirms automated results.   COMPLETE METABOLIC PANEL WITH GFR     Status: Abnormal   Collection Time: 10/13/20 12:00 AM  Result Value Ref Range   Glucose, Bld 78 65 - 99 mg/dL    Comment: .            Fasting reference interval .    BUN 14 7 - 25 mg/dL   Creat 7.84 6.96 - 2.95 mg/dL   GFR, Est Non African American 120 > OR = 60 mL/min/1.36m2   GFR, Est African American 139 > OR = 60 mL/min/1.25m2   BUN/Creatinine Ratio NOT APPLICABLE 6 - 22 (calc)   Sodium 141 135 - 146 mmol/L   Potassium 3.9 3.5 - 5.3 mmol/L   Chloride 103 98 - 110 mmol/L   CO2 28 20 - 32 mmol/L   Calcium 9.9 8.6 - 10.3 mg/dL   Total Protein 7.7 6.1 - 8.1 g/dL   Albumin 5.3 (H) 3.6 - 5.1 g/dL   Globulin 2.4 1.9 - 3.7 g/dL (calc)   AG Ratio 2.2 1.0 - 2.5 (calc)   Total Bilirubin 2.7 (H) 0.2 - 1.2 mg/dL   Alkaline phosphatase (APISO) 60 36 - 130 U/L   AST 17 10 - 40 U/L   ALT 12 9 - 46 U/L  HIV Antibody (routine testing w rflx)     Status: None   Collection Time: 10/13/20 12:00 AM  Result Value Ref Range   HIV 1&2 Ab, 4th Generation NON-REACTIVE NON-REACTIVE    Comment: HIV-1 antigen and HIV-1/HIV-2 antibodies were not detected. There is no laboratory evidence  of HIV infection. Marland Kitchen PLEASE NOTE: This information has been disclosed to you from records whose confidentiality may be protected by state law.  If your state requires such protection, then the state law prohibits you from making any further disclosure of the information without the specific written consent of the person to whom it pertains, or as otherwise permitted by law. A general authorization for the release of  medical or other information is NOT sufficient for this purpose. . For additional information please refer to http://education.questdiagnostics.com/faq/FAQ106 (This link is being provided for informational/ educational purposes only.) . Marland Kitchen The performance of this assay has not been clinically validated in patients less than 4 years old. .   RPR     Status: None   Collection Time: 10/13/20 12:00 AM  Result Value Ref Range   RPR Ser Ql NON-REACTIVE NON-REACTIVE  TSH     Status: None   Collection Time: 10/13/20 12:00 AM  Result Value Ref Range   TSH 1.21 0.40 - 4.50 mIU/L  Urine cytology ancillary only     Status: None   Collection Time: 10/13/20  2:15 PM  Result Value Ref Range   Neisseria Gonorrhea Negative    Chlamydia Negative    Comment Normal Reference Ranger Chlamydia - Negative    Comment      Normal Reference Range Neisseria Gonorrhea - Negative  Cytology (oral, anal, urethral) ancillary only     Status: None   Collection Time: 10/13/20  2:15 PM  Result Value Ref Range   Neisseria Gonorrhea Negative    Chlamydia Negative    Comment Normal Reference Ranger Chlamydia - Negative    Comment      Normal Reference Range Neisseria Gonorrhea - Negative  Cytology (oral, anal, urethral) ancillary only     Status: None   Collection Time: 12/08/20  3:09 PM  Result Value Ref Range   Neisseria Gonorrhea Negative    Chlamydia Negative    Comment Normal Reference Ranger Chlamydia - Negative    Comment      Normal Reference Range Neisseria Gonorrhea - Negative   Cytology (oral, anal, urethral) ancillary only     Status: None   Collection Time: 12/08/20  3:09 PM  Result Value Ref Range   Neisseria Gonorrhea Negative    Chlamydia Negative    Comment Normal Reference Ranger Chlamydia - Negative    Comment      Normal Reference Range Neisseria Gonorrhea - Negative  Cytology (oral, anal, urethral) ancillary only     Status: None   Collection Time: 12/08/20  3:09 PM  Result Value Ref Range   Neisseria Gonorrhea Negative    Chlamydia Negative    Comment Normal Reference Ranger Chlamydia - Negative    Comment      Normal Reference Range Neisseria Gonorrhea - Negative  RPR     Status: None   Collection Time: 12/08/20  3:25 PM  Result Value Ref Range   RPR Ser Ql NON-REACTIVE NON-REACTIVE  HIV Antibody (routine testing w rflx)     Status: None   Collection Time: 12/08/20  3:25 PM  Result Value Ref Range   HIV 1&2 Ab, 4th Generation NON-REACTIVE NON-REACTIVE    Comment: HIV-1 antigen and HIV-1/HIV-2 antibodies were not detected. There is no laboratory evidence of HIV infection. Marland Kitchen PLEASE NOTE: This information has been disclosed to you from records whose confidentiality may be protected by state law.  If your state requires such protection, then the state law prohibits you from making any further disclosure of the information without the specific written consent of the person to whom it pertains, or as otherwise permitted by law. A general authorization for the release of medical or other information is NOT sufficient for this purpose. . For additional information please refer to http://education.questdiagn[facility].com/faq/FAQ106 (This link is being provided for informational/ educational purposes only.) . Marland Kitchen The performance of this assay has not been  clinically validated in patients less than 41 years old. Marland Kitchen   CBC with Differential/Platelet     Status: Abnormal   Collection Time: 12/08/20  3:25 PM  Result Value Ref Range   WBC 4.8 3.8 -  10.8 Thousand/uL   RBC 4.49 4.20 - 5.80 Million/uL   Hemoglobin 13.9 13.2 - 17.1 g/dL   HCT 83.4 19.6 - 22.2 %   MCV 94.7 80.0 - 100.0 fL   MCH 31.0 27.0 - 33.0 pg   MCHC 32.7 32.0 - 36.0 g/dL   RDW 97.9 89.2 - 11.9 %   Platelets 114 (L) 140 - 400 Thousand/uL   MPV 12.7 (H) 7.5 - 12.5 fL   Neutro Abs 3,269 1,500 - 7,800 cells/uL   Lymphs Abs 1,070 850 - 3,900 cells/uL   Absolute Monocytes 312 200 - 950 cells/uL   Eosinophils Absolute 110 15 - 500 cells/uL   Basophils Absolute 38 0 - 200 cells/uL   Neutrophils Relative % 68.1 %   Total Lymphocyte 22.3 %   Monocytes Relative 6.5 %   Eosinophils Relative 2.3 %   Basophils Relative 0.8 %   Smear Review      Comment: Platelet clumps noted on smear-count appears adequate. Review of peripheral smear confirms automated results.   Protein electrophoresis, serum     Status: Abnormal   Collection Time: 12/08/20  3:25 PM  Result Value Ref Range   Total Protein 7.2 6.1 - 8.1 g/dL   Albumin ELP 4.7 3.8 - 4.8 g/dL   Alpha 1 0.2 0.2 - 0.3 g/dL   Alpha 2 0.5 0.5 - 0.9 g/dL   Beta Globulin 0.3 (L) 0.4 - 0.6 g/dL   Beta 2 0.3 0.2 - 0.5 g/dL   Gamma Globulin 1.2 0.8 - 1.7 g/dL   SPE Interp.      Comment: . One or more serum protein fractions are outside the normal ranges.  No abnormal protein bands are apparent. .   TSH     Status: None   Collection Time: 12/08/20  3:25 PM  Result Value Ref Range   TSH 1.45 0.40 - 4.50 mIU/L    PHQ2/9: Depression screen [facility] 2/9 12/20/2020 12/08/2020 10/13/2020 06/09/2020 11/11/2019  Decreased Interest 0 0 0 0 0  Down, Depressed, Hopeless 0 1 1 1  0  PHQ - 2 Score 0 1 1 1  0  Altered sleeping - 0 0 1 0  Tired, decreased energy - 0 1 2 0  Change in appetite - 1 1 0 0  Feeling bad or failure about yourself  - 1 1 0 0  Trouble concentrating - 0 0 1 0  Moving slowly or fidgety/restless - 1 0 0 0  Suicidal thoughts - 0 0 0 0  PHQ-9 Score - 4 4 5  0  Difficult doing work/chores - - - Somewhat difficult -     phq 9 is negative   Fall Risk: Fall Risk  12/20/2020 12/08/2020 10/13/2020 06/09/2020 11/11/2019  Falls in the past year? 0 0 0 0 0  Number falls in past yr: 0 0 0 0 0  Injury with Fall? 0 0 0 0 0  Follow up - - - - -     Functional Status Survey: Is the patient deaf or have difficulty hearing?: No Does the patient have difficulty seeing, even when wearing glasses/contacts?: No Does the patient have difficulty concentrating, remembering, or making decisions?: Yes Does the patient have difficulty walking or climbing stairs?: No Does the patient have difficulty  dressing or bathing?: No Does the patient have difficulty doing errands alone such as visiting a doctor's office or shopping?: No    Assessment & Plan  1. Thrombocytopenia (HCC)  Advised him to contact hematologist

## 2020-12-17 NOTE — Telephone Encounter (Signed)
Copied from CRM [id number]. Topic: General - Other >> Dec 17, 2020 12:37 PM Marylen Ponto wrote: Reason for CRM: Pt would like to request approval for his grandmother to come in with him for his appt on 12/20/20 because he is concerned about not being able to clearly understand what is going on with him losing blood. Pt stated he has been referred to a specialist but he would feel more comfortable if his grandmother is able to attend his next appt with [facility]. Pt requests call back to advise if it is ok for his grandmother to come in with him for his appt.

## 2020-12-20 ENCOUNTER — Ambulatory Visit: Payer: Medicaid Other | Admitting: Family Medicine

## 2020-12-20 ENCOUNTER — Encounter: Payer: Self-pay | Admitting: Family Medicine

## 2020-12-20 ENCOUNTER — Other Ambulatory Visit: Payer: Self-pay

## 2020-12-20 VITALS — BP 118/80 | HR 61 | Temp 97.6°F | Resp 16 | Ht 68.0 in | Wt 131.0 lb

## 2020-12-20 DIAGNOSIS — D696 Thrombocytopenia, unspecified: Secondary | ICD-10-CM | POA: Diagnosis not present

## 2020-12-20 NOTE — Patient Instructions (Signed)
Hematology: [phone]

## 2020-12-27 ENCOUNTER — Inpatient Hospital Stay: Payer: Medicaid Other

## 2020-12-27 ENCOUNTER — Inpatient Hospital Stay: Payer: Medicaid Other | Admitting: Oncology

## 2020-12-31 ENCOUNTER — Inpatient Hospital Stay: Payer: Medicaid Other

## 2020-12-31 ENCOUNTER — Inpatient Hospital Stay: Payer: Medicaid Other | Attending: Oncology | Admitting: Oncology

## 2020-12-31 ENCOUNTER — Encounter: Payer: Self-pay | Admitting: Oncology

## 2020-12-31 VITALS — BP 100/71 | HR 63 | Temp 97.4°F | Resp 16 | Wt 131.4 lb

## 2020-12-31 DIAGNOSIS — D696 Thrombocytopenia, unspecified: Secondary | ICD-10-CM

## 2020-12-31 LAB — CBC WITH DIFFERENTIAL/PLATELET
Abs Immature Granulocytes: 0.01 10*3/uL (ref 0.00–0.07)
Basophils Absolute: 0 10*3/uL (ref 0.0–0.1)
Basophils Relative: 1 %
Eosinophils Absolute: 0.1 10*3/uL (ref 0.0–0.5)
Eosinophils Relative: 2 %
HCT: 46 % (ref 39.0–52.0)
Hemoglobin: 15.2 g/dL (ref 13.0–17.0)
Immature Granulocytes: 0 %
Lymphocytes Relative: 26 %
Lymphs Abs: 1 10*3/uL (ref 0.7–4.0)
MCH: 32.1 pg (ref 26.0–34.0)
MCHC: 33 g/dL (ref 30.0–36.0)
MCV: 97.3 fL (ref 80.0–100.0)
Monocytes Absolute: 0.3 10*3/uL (ref 0.1–1.0)
Monocytes Relative: 8 %
Neutro Abs: 2.5 10*3/uL (ref 1.7–7.7)
Neutrophils Relative %: 63 %
Platelets: 194 10*3/uL (ref 150–400)
RBC: 4.73 MIL/uL (ref 4.22–5.81)
RDW: 11.9 % (ref 11.5–15.5)
WBC: 4 10*3/uL (ref 4.0–10.5)
nRBC: 0 % (ref 0.0–0.2)

## 2020-12-31 LAB — TECHNOLOGIST SMEAR REVIEW
Plt Morphology: NORMAL
RBC MORPHOLOGY: NORMAL
WBC MORPHOLOGY: NORMAL

## 2020-12-31 LAB — LACTATE DEHYDROGENASE: LDH: 120 U/L (ref 98–192)

## 2020-12-31 LAB — FOLATE: Folate: 7.3 ng/mL (ref >5.9)

## 2020-12-31 LAB — VITAMIN B12: Vitamin B-12: 396 pg/mL (ref 180–914)

## 2020-12-31 LAB — HEPATITIS C ANTIBODY: HCV Ab: NONREACTIVE

## 2021-01-02 ENCOUNTER — Encounter: Payer: Self-pay | Admitting: Oncology

## 2021-01-02 NOTE — Progress Notes (Signed)
I connected with Benjamin Pitts on 01/02/21 at  1:30 PM EDT by video enabled telemedicine visit and verified that I am speaking with the correct person using two identifiers.   I discussed the limitations, risks, security and privacy concerns of performing an evaluation and management service by telemedicine and the availability of in-person appointments. I also discussed with the patient that there may be a patient responsible charge related to this service. The patient expressed understanding and agreed to proceed.  Other persons participating in the visit and their role in the encounter:  patients mother   Patient's location:  [facility] center Provider's location:  home  Reason for consult: Thrombocytopenia Requesting provider: Dr. Carlynn Purl   History of present illness: Patient is a 25 year old male with a past medical history significant for allergic rhinitis, ADHD, depression who has been referred to [place] for thrombocytopenia.  On 10/13/2020 patient was noted to have a white count of 4.5, H&H of 15.4/47.1 and a platelet count of 120.  Repeat CBC on 12/08/2020 showed a platelet count of 114 with a normal white count and hemoglobin and therefore patient has been referred to [place].  He currently reports some ongoing fatigue but denies any bleeding or bruising.  He is on Wellbutrin and Lexapro for depression.  On Vyvanse for ADHD and Truvada for HIV prophylaxis.  He has had recent HIV testing done on 10/13/2020 which was negative.  Patient reports that he feels angry at times but denies any suicidal or homicidal ideation.  He was concerned about his weight loss but he is eating a little better and has gained about 9 pounds in the last 3 weeks.  His work does require him to travel a lot and he does not get adequate sleep  Review of Systems  Constitutional:  Positive for malaise/fatigue. Negative for chills, fever and weight loss.  HENT:  Negative for congestion, ear discharge and nosebleeds.   Eyes:  Negative  for blurred vision.  Respiratory:  Negative for cough, hemoptysis, sputum production, shortness of breath and wheezing.   Cardiovascular:  Negative for chest pain, palpitations, orthopnea and claudication.  Gastrointestinal:  Negative for abdominal pain, blood in stool, constipation, diarrhea, heartburn, melena, nausea and vomiting.  Genitourinary:  Negative for dysuria, flank pain, frequency, hematuria and urgency.  Musculoskeletal:  Negative for back pain, joint pain and myalgias.  Skin:  Negative for rash.  Neurological:  Negative for dizziness, tingling, focal weakness, seizures, weakness and headaches.  Endo/Heme/Allergies:  Does not bruise/bleed easily.  Psychiatric/Behavioral:  Negative for depression and suicidal ideas. The patient does not have insomnia.    No Known Allergies  Past Medical History:  Diagnosis Date   Acne    Acute eczema    ADD (attention deficit disorder)    Allergy    Asthma    Problems with learning     History reviewed. No pertinent surgical history.  Social History   Socioeconomic History   Marital status: Single    Spouse name: Not on file   Number of children: 0   Years of education: Not on file   Highest education level: Associate degree: academic program  Occupational History   Occupation: unemployed  Tobacco Use   Smoking status: Never   Smokeless tobacco: Never  Vaping Use   Vaping Use: Never used  Substance and Sexual Activity   Alcohol use: No    Alcohol/week: 0.0 standard drinks   Drug use: No   Sexual activity: Yes    Partners: Male, Male  Birth control/protection: Condom    Comment: bi-curious   Other Topics Concern   Not on file  Social History Narrative   Graduated HS Spring 2017   Went to [employer], tried to transfer to [employer] but fell through - lack of paperwork    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    Social Determinants of Corporate investment banker Strain: Low Risk     Difficulty of Paying Living Expenses: Not hard at all  Food Insecurity: No Food Insecurity   Worried About Programme researcher, broadcasting/film/video in the Last Year: Never true   Barista in the Last Year: Never true  Transportation Needs: No Transportation Needs   Lack of Transportation (Medical): No   Lack of Transportation (Non-Medical): No  Physical Activity: Insufficiently Active   Days of Exercise per Week: 3 days   Minutes of Exercise per Session: 30 min  Stress: No Stress Concern Present   Feeling of Stress : Only a little  Social Connections: Moderately Integrated   Frequency of Communication with Friends and Family: Three times a week   Frequency of Social Gatherings with Friends and Family: Twice a week   Attends Religious Services: More than 4 times per year   Active Member of [employer] or Organizations: Yes   Attends Engineer, structural: More than 4 times per year   Marital Status: Never married  Catering manager Violence: Not At Risk   Fear of Current or Ex-Partner: No   Emotionally Abused: No   Physically Abused: No   Sexually Abused: No    Family History  Problem Relation Age of Onset   Asthma Sister    Seizures Sister      Current Outpatient Medications:    albuterol (VENTOLIN HFA) 108 (90 Base) MCG/ACT inhaler, Inhale 2 puffs into the lungs every 6 (six) hours as needed for wheezing or shortness of breath., Disp: 18 g, Rfl: 0   buPROPion (WELLBUTRIN XL) 150 MG 24 hr tablet, Take 1 tablet (150 mg total) by mouth at bedtime., Disp: 90 tablet, Rfl: 1   emtricitabine-tenofovir (TRUVADA) 200-300 MG tablet, Take 1 tablet by mouth daily., Disp: 90 tablet, Rfl: 1   escitalopram (LEXAPRO) 10 MG tablet, Take 1 tablet (10 mg total) by mouth daily., Disp: 90 tablet, Rfl: 1   fluticasone-salmeterol (ADVAIR) 100-50 MCG/ACT AEPB, Inhale 1 puff into the lungs 2 (two) times daily., Disp: 1 each, Rfl: 5   lisdexamfetamine (VYVANSE) 40 MG capsule, Take 1 capsule (40 mg total) by mouth  every morning., Disp: 30 capsule, Rfl: 0   lisdexamfetamine (VYVANSE) 40 MG capsule, Take 1 capsule (40 mg total) by mouth daily., Disp: 30 capsule, Rfl: 0   lisdexamfetamine (VYVANSE) 40 MG capsule, Take 1 capsule (40 mg total) by mouth every morning., Disp: 30 capsule, Rfl: 0   loratadine (CLARITIN) 10 MG tablet, Take 1 tablet (10 mg total) by mouth daily., Disp: 90 tablet, Rfl: 3  No results found.  No images are attached to the encounter.   CMP Latest Ref Rng & Units 12/08/2020  Glucose 65 - 99 mg/dL -  BUN 7 - 25 mg/dL -  Creatinine 6.29 - 4.76 mg/dL -  Sodium 546 - 503 mmol/L -  Potassium 3.5 - 5.3 mmol/L -  Chloride 98 - 110 mmol/L -  CO2 20 - 32 mmol/L -  Calcium 8.6 - 10.3 mg/dL -  Total Protein 6.1 - 8.1 g/dL  7.2  Total Bilirubin 0.2 - 1.2 mg/dL -  AST 10 - 40 U/L -  ALT 9 - 46 U/L -   CBC Latest Ref Rng & Units 12/31/2020  WBC 4.0 - 10.5 K/uL 4.0  Hemoglobin 13.0 - 17.0 g/dL 63.1  Hematocrit 49.7 - 52.0 % 46.0  Platelets 150 - 400 K/uL 194     Observation/objective: Appears in no acute distress over video visit today.  Breathing is nonlabored  Assessment and plan: Patient is a 25 year old male referred for mild isolated thrombocytopenia  I had to conduct the visit virtually today as I tested positive for COVID and therefore could not be present in person at the clinic to examine the patient.  Overall patient has mild isolated thrombocytopenia in the absence of other cytopenias.No recent change in his medications and drugs like Wellbutrin and Lexapro have very small risk of thrombocytopenia mainly shown in postmarketing results.  I do not see a reason to change any of his medications at this time.  Today I will get a repeat CBC with differential, smear review, hepatitis C testing, B12 folate and stool H. pylori testing.  I will see the patient in 2 weeks time to discuss the results of blood work and further management    Follow-up instructions:  I discussed the  assessment and treatment plan with the patient. The patient was provided an opportunity to ask questions and all were answered. The patient agreed with the plan and demonstrated an understanding of the instructions.   The patient was advised to call back or seek an in-person evaluation if the symptoms worsen or if the condition fails to improve as anticipated. Visit Diagnosis: 1. Thrombocytopenia (HCC)     Dr. Owens Shark, MD, MPH [facility] Tel- [phone] 01/02/2021 9:30 PM

## 2021-01-11 ENCOUNTER — Telehealth: Payer: Self-pay

## 2021-01-12 ENCOUNTER — Telehealth: Payer: Self-pay

## 2021-01-14 ENCOUNTER — Inpatient Hospital Stay: Payer: Medicaid Other | Admitting: Oncology

## 2021-02-14 ENCOUNTER — Other Ambulatory Visit: Payer: Self-pay

## 2021-02-14 DIAGNOSIS — D696 Thrombocytopenia, unspecified: Secondary | ICD-10-CM

## 2021-02-16 ENCOUNTER — Inpatient Hospital Stay: Payer: Medicaid Other | Attending: Oncology | Admitting: Oncology

## 2021-02-16 ENCOUNTER — Encounter: Payer: Self-pay | Admitting: Oncology

## 2021-02-16 ENCOUNTER — Other Ambulatory Visit: Payer: Self-pay

## 2021-02-16 VITALS — BP 103/66 | HR 52 | Temp 97.1°F | Resp 14 | Wt 131.0 lb

## 2021-02-16 DIAGNOSIS — F909 Attention-deficit hyperactivity disorder, unspecified type: Secondary | ICD-10-CM | POA: Insufficient documentation

## 2021-02-16 DIAGNOSIS — Z7951 Long term (current) use of inhaled steroids: Secondary | ICD-10-CM | POA: Insufficient documentation

## 2021-02-16 DIAGNOSIS — D696 Thrombocytopenia, unspecified: Secondary | ICD-10-CM | POA: Insufficient documentation

## 2021-02-17 ENCOUNTER — Encounter: Payer: Self-pay | Admitting: *Deleted

## 2021-02-17 NOTE — Progress Notes (Signed)
Clinical Social Worker received referral. CSW contacted patient and left VM to return call. CSW will attempt to reach patient again.  Inocencio Homes, MSW, LCSW, OSW-C Clinical Social Worker [facility] [phone]

## 2021-02-20 NOTE — Progress Notes (Signed)
Hematology/Oncology Consult note [facility]  [phone] [phone]  Patient Care Team: Alba Cory, MD as PCP - General (Family Medicine)   Name of the patient: Benjamin Pitts  [id number]  [birth year]   Date of visit: 02/20/21  Diagnosis-thrombocytopenia resolved  Chief complaint/ Reason for visit-discuss blood work results  Heme/Onc history:  Patient is a 25 year old male with a past medical history significant for allergic rhinitis, ADHD, depression who has been referred to [place] for thrombocytopenia.  On 10/13/2020 patient was noted to have a white count of 4.5, H&H of 15.4/47.1 and a platelet count of 120.  Repeat CBC on 12/08/2020 showed a platelet count of 114 with a normal white count and hemoglobin and therefore patient has been referred to [place].  Benjamin Pitts currently reports some ongoing fatigue but denies any bleeding or bruising.  Benjamin Pitts is on Wellbutrin and Lexapro for depression.  On Vyvanse for ADHD and Truvada for HIV prophylaxis.  Benjamin Pitts has had recent HIV testing done on 10/13/2020 which was negative.   Patient reports that Benjamin Pitts feels angry at times but denies any suicidal or homicidal ideation.  Benjamin Pitts was concerned about his weight loss but Benjamin Pitts is eating a little better and has gained about 9 pounds in the last 3 weeks.  His work does require him to travel a lot and Benjamin Pitts does not get adequate sleep  Interval history-denies any acute issues at this time.  Denies any bleeding or bruising  ECOG PS- 0 Pain scale- 0   Review of systems- Review of Systems  Constitutional:  Negative for chills, fever, malaise/fatigue and weight loss.  HENT:  Negative for congestion, ear discharge and nosebleeds.   Eyes:  Negative for blurred vision.  Respiratory:  Negative for cough, hemoptysis, sputum production, shortness of breath and wheezing.   Cardiovascular:  Negative for chest pain, palpitations, orthopnea and claudication.  Gastrointestinal:  Negative for  abdominal pain, blood in stool, constipation, diarrhea, heartburn, melena, nausea and vomiting.  Genitourinary:  Negative for dysuria, flank pain, frequency, hematuria and urgency.  Musculoskeletal:  Negative for back pain, joint pain and myalgias.  Skin:  Negative for rash.  Neurological:  Negative for dizziness, tingling, focal weakness, seizures, weakness and headaches.  Endo/Heme/Allergies:  Does not bruise/bleed easily.  Psychiatric/Behavioral:  Negative for depression and suicidal ideas. The patient does not have insomnia.       No Known Allergies   Past Medical History:  Diagnosis Date   Acne    Acute eczema    ADD (attention deficit disorder)    Allergy    Asthma    Problems with learning      History reviewed. No pertinent surgical history.  Social History   Socioeconomic History   Marital status: Single    Spouse name: Not on file   Number of children: 0   Years of education: Not on file   Highest education level: Associate degree: academic program  Occupational History   Occupation: unemployed  Tobacco Use   Smoking status: Never   Smokeless tobacco: Never  Vaping Use   Vaping Use: Never used  Substance and Sexual Activity   Alcohol use: No    Alcohol/week: 0.0 standard drinks   Drug use: No   Sexual activity: Yes    Partners: Male, Male    Birth control/protection: Condom    Comment: bi-curious   Other Topics Concern   Not on file  Social History Narrative   Graduated HS Spring 2017  Went to [employer], tried to transfer to [employer] but fell through - lack of paperwork    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    Social Determinants of Corporate investment banker Strain: Low Risk    Difficulty of Paying Living Expenses: Not hard at all  Food Insecurity: No Food Insecurity   Worried About Programme researcher, broadcasting/film/video in the Last Year: Never true   Barista in the Last Year: Never true  Transportation Needs: No  Transportation Needs   Lack of Transportation (Medical): No   Lack of Transportation (Non-Medical): No  Physical Activity: Insufficiently Active   Days of Exercise per Week: 3 days   Minutes of Exercise per Session: 30 min  Stress: No Stress Concern Present   Feeling of Stress : Only a little  Social Connections: Moderately Integrated   Frequency of Communication with Friends and Family: Three times a week   Frequency of Social Gatherings with Friends and Family: Twice a week   Attends Religious Services: More than 4 times per year   Active Member of [employer] or Organizations: Yes   Attends Engineer, structural: More than 4 times per year   Marital Status: Never married  Catering manager Violence: Not At Risk   Fear of Current or Ex-Partner: No   Emotionally Abused: No   Physically Abused: No   Sexually Abused: No    Family History  Problem Relation Age of Onset   Asthma Sister    Seizures Sister      Current Outpatient Medications:    albuterol (VENTOLIN HFA) 108 (90 Base) MCG/ACT inhaler, Inhale 2 puffs into the lungs every 6 (six) hours as needed for wheezing or shortness of breath., Disp: 18 g, Rfl: 0   buPROPion (WELLBUTRIN XL) 150 MG 24 hr tablet, Take 1 tablet (150 mg total) by mouth at bedtime., Disp: 90 tablet, Rfl: 1   emtricitabine-tenofovir (TRUVADA) 200-300 MG tablet, Take 1 tablet by mouth daily., Disp: 90 tablet, Rfl: 1   escitalopram (LEXAPRO) 10 MG tablet, Take 1 tablet (10 mg total) by mouth daily., Disp: 90 tablet, Rfl: 1   fluticasone-salmeterol (ADVAIR) 100-50 MCG/ACT AEPB, Inhale 1 puff into the lungs 2 (two) times daily., Disp: 1 each, Rfl: 5   lisdexamfetamine (VYVANSE) 40 MG capsule, Take 1 capsule (40 mg total) by mouth every morning., Disp: 30 capsule, Rfl: 0   loratadine (CLARITIN) 10 MG tablet, Take 1 tablet (10 mg total) by mouth daily., Disp: 90 tablet, Rfl: 3  Physical exam:  Vitals:   02/16/21 1416  BP: 103/66  Pulse: (!) 52  Resp: 14   Temp: (!) 97.1 F (36.2 C)  SpO2: 100%  Weight: 131 lb (59.4 kg)   Physical Exam Constitutional:      General: Benjamin Pitts is not in acute distress. Cardiovascular:     Rate and Rhythm: Normal rate and regular rhythm.     Heart sounds: Normal heart sounds.  Pulmonary:     Effort: Pulmonary effort is normal.     Breath sounds: Normal breath sounds.  Abdominal:     General: Bowel sounds are normal.     Palpations: Abdomen is soft.  Skin:    General: Skin is warm and dry.  Neurological:     Mental Status: Benjamin Pitts is alert and oriented to person, place, and time.     CMP Latest Ref Rng & Units 12/08/2020  Glucose 65 -  99 mg/dL -  BUN 7 - 25 mg/dL -  Creatinine 9.73 - 5.32 mg/dL -  Sodium 992 - 426 mmol/L -  Potassium 3.5 - 5.3 mmol/L -  Chloride 98 - 110 mmol/L -  CO2 20 - 32 mmol/L -  Calcium 8.6 - 10.3 mg/dL -  Total Protein 6.1 - 8.1 g/dL 7.2  Total Bilirubin 0.2 - 1.2 mg/dL -  AST 10 - 40 U/L -  ALT 9 - 46 U/L -   CBC Latest Ref Rng & Units 12/31/2020  WBC 4.0 - 10.5 K/uL 4.0  Hemoglobin 13.0 - 17.0 g/dL 83.4  Hematocrit 19.6 - 52.0 % 46.0  Platelets 150 - 400 K/uL 194     Assessment and plan- Patient is a 25 y.o. male referred for mild isolated thrombocytopenia  On repeat CBC patient's platelet count was normal at 194 with a normal white cell count of 4 and H&H of 15.2/46.  B12 levels normal at 396 and folate normal at 7.3.  HCV testing negative.  LDH normal.  Prior HIV testing was negative as well.  Given that his platelet counts have normalized Benjamin Pitts does not require any follow-up with me at this time.  If there are any concerns in the future about his platelet counts Benjamin Pitts can be referred back to me.  Weight has been stable for the last few months   Visit Diagnosis 1. Thrombocytopenia (HCC)      Dr. Owens Shark, MD, MPH [facility] [phone] 02/20/2021 12:00 PM

## 2021-03-09 ENCOUNTER — Other Ambulatory Visit (HOSPITAL_COMMUNITY)
Admission: RE | Admit: 2021-03-09 | Discharge: 2021-03-09 | Disposition: A | Discharge status: Home / Self Care | Payer: Medicaid Other | Source: Ambulatory Visit | Attending: Family Medicine | Admitting: Family Medicine

## 2021-03-09 ENCOUNTER — Ambulatory Visit: Payer: Medicaid Other | Admitting: Family Medicine

## 2021-03-09 ENCOUNTER — Encounter: Payer: Self-pay | Admitting: Family Medicine

## 2021-03-09 ENCOUNTER — Other Ambulatory Visit: Payer: Self-pay

## 2021-03-09 VITALS — BP 108/70 | HR 81 | Temp 98.1°F | Resp 16 | Ht 68.0 in | Wt 131.0 lb

## 2021-03-09 DIAGNOSIS — Z23 Encounter for immunization: Secondary | ICD-10-CM

## 2021-03-09 DIAGNOSIS — J4520 Mild intermittent asthma, uncomplicated: Secondary | ICD-10-CM

## 2021-03-09 DIAGNOSIS — F901 Attention-deficit hyperactivity disorder, predominantly hyperactive type: Secondary | ICD-10-CM

## 2021-03-09 DIAGNOSIS — Z7252 High risk homosexual behavior: Secondary | ICD-10-CM | POA: Diagnosis not present

## 2021-03-09 DIAGNOSIS — D696 Thrombocytopenia, unspecified: Secondary | ICD-10-CM

## 2021-03-09 DIAGNOSIS — E440 Moderate protein-calorie malnutrition: Secondary | ICD-10-CM

## 2021-03-09 MED ORDER — LISDEXAMFETAMINE DIMESYLATE 40 MG PO CAPS: 40.0000 mg | ORAL_CAPSULE | ORAL | 0 refills | Status: DC

## 2021-03-09 NOTE — Addendum Note (Signed)
Addended by: Dollene Primrose on: 03/09/2021 03:29 PM   Modules accepted: Orders

## 2021-03-09 NOTE — Progress Notes (Signed)
Name: Benjamin Pitts   MRN: [id number]    DOB: January 31, 1996   Date:03/09/2021       Progress Note  Subjective  Chief Complaint  Follow Up  HPI  ADD with learning disability: He is no longer going to [employer], he stopped going because he did not like the school environment . 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   AR: he states doing well this time of the year, usually worse in the Fall but he states doing fine at this time, no rhinorrhea, congestion or itchy eyes at this time .  He takes loratadine     Asthma: He denies cough, sob or wheezing , but last visit he was wheezing during exam and gave him Advair, he is only taking it prn, last visit I  advised him  to use at least once a day and reminded him medication expires if opened for 60 days, but he continues to use it only sporadically    MDD: he did not go see Dr. Elna Breslow, states he had car problems and did not know where it was. He has been feeling better, phq 9 is positive. He states Lexapro and wellbutrin have been helpful, phq 9 today is down to 2, he states feeling better. Looking forward to new job   History of STI/high risk : he has been with the same partner for the past 6 weeks , he uses condoms, he is bisexual, he has been taking Truvada and has been compliant, no side effects. He states he was checked for STI back in July but has a new partner and we will recheck today   Malnutrition: he was 143 lbs in January and dropped  down to 122 lbs. He is now at 131 lbs and stable. He is only taking Vyvanse prn only and unlikely the cause of weight loss. Discussed high calorie diet again, he states follows it on and off   Thrombocytopenia:  sent him to hematologist and platelets back to normal, he will go back in January     Patient Active Problem List   Diagnosis Date Noted   Thrombocytopenia (HCC) 12/08/2020   Major depression, recurrent, chronic (HCC)  06/09/2020   Eczema 02/23/2015   ADHD (attention deficit hyperactivity disorder) 02/23/2015   Allergic rhinitis, seasonal 02/23/2015   Asthma, mild intermittent 02/23/2015   Learning problem 02/23/2015    No past surgical history on file.  Family History  Problem Relation Age of Onset   Asthma Sister    Seizures Sister     Social History   Tobacco Use   Smoking status: Never   Smokeless tobacco: Never  Substance Use Topics   Alcohol use: No    Alcohol/week: 0.0 standard drinks     Current Outpatient Medications:    albuterol (VENTOLIN HFA) 108 (90 Base) MCG/ACT inhaler, Inhale 2 puffs into the lungs every 6 (six) hours as needed for wheezing or shortness of breath., Disp: 18 g, Rfl: 0   buPROPion (WELLBUTRIN XL) 150 MG 24 hr tablet, Take 1 tablet (150 mg total) by mouth at bedtime., Disp: 90 tablet, Rfl: 1   emtricitabine-tenofovir (TRUVADA) 200-300 MG tablet, Take 1 tablet by mouth daily., Disp: 90 tablet, Rfl: 1   escitalopram (LEXAPRO) 10 MG tablet, Take 1 tablet (10 mg total) by mouth daily., Disp: 90 tablet, Rfl: 1   fluticasone-salmeterol (ADVAIR) 100-50 MCG/ACT AEPB, Inhale 1 puff  into the lungs 2 (two) times daily., Disp: 1 each, Rfl: 5   lisdexamfetamine (VYVANSE) 40 MG capsule, Take 1 capsule (40 mg total) by mouth every morning., Disp: 30 capsule, Rfl: 0   loratadine (CLARITIN) 10 MG tablet, Take 1 tablet (10 mg total) by mouth daily., Disp: 90 tablet, Rfl: 3  No Known Allergies  I personally reviewed active problem list, medication list, allergies, family history, social history, health maintenance with the patient/caregiver today.   ROS  Constitutional: Negative for fever or weight change.  Respiratory: Negative for cough and shortness of breath.   Cardiovascular: Negative for chest pain or palpitations.  Gastrointestinal: Negative for abdominal pain, no bowel changes.  Musculoskeletal: Negative for gait problem or joint swelling.  Skin: Negative for rash.   Neurological: Negative for dizziness or headache.  No other specific complaints in a complete review of systems (except as listed in HPI above).   Objective  Vitals:   03/09/21 1446  BP: 108/70  Pulse: 81  Resp: 16  Temp: 98.1 F (36.7 C)  SpO2: 97%  Weight: 131 lb (59.4 kg)  Height: 5\' 8"  (1.727 m)    Body mass index is 19.92 kg/m.  Physical Exam  Constitutional: Patient appears well-developed and well-nourished.  No distress.  HEENT: head atraumatic, normocephalic, pupils equal and reactive to light, neck supple Cardiovascular: Normal rate, regular rhythm and normal heart sounds.  No murmur heard. No BLE edema. Pulmonary/Chest: Effort normal and breath sounds normal. No respiratory distress. Abdominal: Soft.  There is no tenderness. Psychiatric: Patient has a normal mood and affect. behavior is normal. Judgment and thought content normal.   Recent Results (from the past 2160 hour(s))  Lactate dehydrogenase     Status: None   Collection Time: 12/31/20  2:51 PM  Result Value Ref Range   LDH 120 98 - 192 U/L    Comment: Performed at [facility] [address] [place], [place]  Hepatitis C antibody     Status: None   Collection Time: 12/31/20  2:51 PM  Result Value Ref Range   HCV Ab NON REACTIVE NON REACTIVE    Comment: (NOTE) Nonreactive HCV antibody screen is consistent with no HCV infections,  unless recent infection is suspected or other evidence exists to indicate HCV infection.  Performed at [facility], [address], [place], [place]   Folate     Status: None   Collection Time: 12/31/20  2:51 PM  Result Value Ref Range   Folate 7.3 >5.9 ng/mL    Comment: Performed at [facility], [address] [place], [place]  Vitamin B12     Status: None   Collection Time: 12/31/20  2:51 PM  Result Value Ref Range   Vitamin B-12 396 180 - 914 pg/mL    Comment: (NOTE) This assay is not validated for testing  neonatal or myeloproliferative syndrome specimens for Vitamin B12 levels. Performed at [facility], [address] [place], [place]   CBC with Differential/Platelet     Status: None   Collection Time: 12/31/20  2:51 PM  Result Value Ref Range   WBC 4.0 4.0 - 10.5 K/uL   RBC 4.73 4.22 - 5.81 MIL/uL   Hemoglobin 15.2 13.0 - 17.0 g/dL   HCT 01/02/21 02.7 - 74.1 %   MCV 97.3 80.0 - 100.0 fL   MCH 32.1 26.0 - 34.0 pg   MCHC 33.0 30.0 - 36.0 g/dL  RDW 11.9 11.5 - 15.5 %   Platelets 194 150 - 400 K/uL   nRBC 0.0 0.0 - 0.2 %   Neutrophils Relative % 63 %   Neutro Abs 2.5 1.7 - 7.7 K/uL   Lymphocytes Relative 26 %   Lymphs Abs 1.0 0.7 - 4.0 K/uL   Monocytes Relative 8 %   Monocytes Absolute 0.3 0.1 - 1.0 K/uL   Eosinophils Relative 2 %   Eosinophils Absolute 0.1 0.0 - 0.5 K/uL   Basophils Relative 1 %   Basophils Absolute 0.0 0.0 - 0.1 K/uL   Immature Granulocytes 0 %   Abs Immature Granulocytes 0.01 0.00 - 0.07 K/uL    Comment: Performed at [facility], [address], [place], [place] [address]  Technologist smear review     Status: None   Collection Time: 12/31/20  2:57 PM  Result Value Ref Range   WBC MORPHOLOGY Normal RBC, WBC, and platelet    RBC MORPHOLOGY Normal RBC, WBC, and platelet    Tech Review Normal RBC, WBC, and platelet     Comment: Performed at [facility], [address] [place], [place] [address]     PHQ2/9: Depression screen [facility] 2/9 03/09/2021 12/20/2020 12/08/2020 10/13/2020 06/09/2020  Decreased Interest 1 0 0 0 0  Down, Depressed, Hopeless 0 0 1 1 1   PHQ - 2 Score 1 0 1 1 1   Altered sleeping 0 - 0 0 1  Tired, decreased energy 0 - 0 1 2  Change in appetite 0 - 1 1 0  Feeling bad or failure about yourself  0 - 1 1 0  Trouble concentrating 0 - 0 0 1  Moving slowly or fidgety/restless 0 - 1 0 0  Suicidal thoughts 0 - 0 0 0  PHQ-9 Score 1 - 4 4 5   Difficult doing work/chores - - - - Somewhat difficult  Some recent data might  be hidden    phq 9 is positive   Fall Risk: Fall Risk  03/09/2021 12/20/2020 12/08/2020 10/13/2020 06/09/2020  Falls in the past year? 0 0 0 0 0  Number falls in past yr: 0 0 0 0 0  Injury with Fall? 0 0 0 0 0  Risk for fall due to : No Fall Risks - - - -  Follow up Falls prevention discussed - - - -      Functional Status Survey: Is the patient deaf or have difficulty hearing?: No Does the patient have difficulty seeing, even when wearing glasses/contacts?: No Does the patient have difficulty concentrating, remembering, or making decisions?: No Does the patient have difficulty walking or climbing stairs?: No Does the patient have difficulty dressing or bathing?: No Does the patient have difficulty doing errands alone such as visiting a doctor's office or shopping?: No    Assessment & Plan  1. Moderate protein-calorie malnutrition (HCC)  Doing better   2. Thrombocytopenia (HCC)  Keep follow up with hematologist, will have labs done in January   3. Attention-deficit hyperactivity disorder, predominantly hyperactive type  - lisdexamfetamine (VYVANSE) 40 MG capsule; Take 1 capsule (40 mg total) by mouth every morning.  Dispense: 30 capsule; Refill: 0  4. Need for immunization against influenza  - Flu Vaccine QUAD 6+ mos PF IM (Fluarix Quad PF)  5. High risk homosexual behavior  - RPR - HIV Antibody (routine testing w rflx) - Urine cytology ancillary only - Cytology - non gyn - Cytology - non gyn  6. Mild intermittent asthma without  complication

## 2021-03-10 ENCOUNTER — Telehealth: Payer: Self-pay

## 2021-03-10 LAB — HIV ANTIBODY (ROUTINE TESTING W REFLEX): HIV 1&2 Ab, 4th Generation: NONREACTIVE

## 2021-03-10 LAB — RPR: RPR Ser Ql: NONREACTIVE

## 2021-03-10 NOTE — Telephone Encounter (Signed)
Copied from CRM [id number]. Topic: General - Other >> Mar 10, 2021 10:33 AM Jaquita Rector A wrote: Reason for CRM: Corrie Dandy with Cytology called in to inform Dr Carlynn Purl that there is are specimens and no test ordered need the orders please for both anal and oral swab Ph# [phone]

## 2021-03-10 NOTE — Telephone Encounter (Signed)
Spoke with mary in Cytology. Order clarified.

## 2021-03-11 LAB — CYTOLOGY, (ORAL, ANAL, URETHRAL) ANCILLARY ONLY
Chlamydia: NEGATIVE
Chlamydia: NEGATIVE
Comment: NEGATIVE
Comment: NEGATIVE
Comment: NORMAL
Comment: NORMAL
Neisseria Gonorrhea: NEGATIVE
Neisseria Gonorrhea: NEGATIVE

## 2021-03-11 LAB — URINE CYTOLOGY ANCILLARY ONLY
Chlamydia: NEGATIVE
Comment: NEGATIVE
Comment: NORMAL
Neisseria Gonorrhea: NEGATIVE

## 2021-04-13 NOTE — Telephone Encounter (Signed)
Signing previous note, see note on 8/31 

## 2021-04-13 NOTE — Telephone Encounter (Signed)
Signing previous note, see note on 8/30 

## 2021-05-19 ENCOUNTER — Other Ambulatory Visit: Payer: Medicaid Other

## 2021-05-23 ENCOUNTER — Inpatient Hospital Stay: Payer: Medicaid Other | Attending: Oncology

## 2021-06-10 NOTE — Progress Notes (Signed)
Name: Benjamin Pitts   MRN: [id number]    DOB: 1996-04-24   Date:06/13/2021       Progress Note  Subjective  Chief Complaint  Follow Up  HPI  ADD with learning disability: He is no longer going to [facility], he stopped going because he did not like the school environment . 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    MDD: he did not go see Dr. Shea Evans, states he had car problems and did not know where it was. He has been feeling better, phq 9 is positive. He states Lexapro and wellbutrin have been helpful, phq 9 today is down to 2, he asked me to continue prescribing his medications   History of STI/high risk : he has been with the same partner for the past 4 months but we have not checked for STI since. He uses condoms, he is bisexual, he has been taking Truvada and has been compliant. He is willing to get rechecked today   Malnutrition: he was 143 lbs in January  22 and dropped  down to 122 lbs but weight is gradually trending up and today is 139 lbs.   Thrombocytopenia:  sent him to hematologist and platelets back to normal, he was released from his care    Patient Active Problem List   Diagnosis Date Noted   Thrombocytopenia ([place]) 12/08/2020   Major depression, recurrent, chronic ([place]) 06/09/2020   Eczema 02/23/2015   ADHD (attention deficit hyperactivity disorder) 02/23/2015   Allergic rhinitis, seasonal 02/23/2015   Asthma, mild intermittent 02/23/2015   Learning problem 02/23/2015    No past surgical history on file.  Family History  Problem  Relation Age of Onset   Asthma Sister    Seizures Sister     Social History   Tobacco Use   Smoking status: Never   Smokeless tobacco: Never  Substance Use Topics   Alcohol use: No    Alcohol/week: 0.0 standard drinks     Current Outpatient Medications:    albuterol (VENTOLIN HFA) 108 (90 Base) MCG/ACT inhaler, Inhale 2 puffs into the lungs every 6 (six) hours as needed for wheezing or shortness of breath., Disp: 18 g, Rfl: 0   buPROPion (WELLBUTRIN XL) 150 MG 24 hr tablet, Take 1 tablet (150 mg total) by mouth at bedtime., Disp: 90 tablet, Rfl: 1   emtricitabine-tenofovir (TRUVADA) 200-300 MG tablet, Take 1 tablet by mouth daily., Disp: 90 tablet, Rfl: 1   escitalopram (LEXAPRO) 10 MG tablet, Take 1 tablet (10 mg total) by mouth daily., Disp: 90 tablet, Rfl: 1   fluticasone-salmeterol (ADVAIR) 100-50 MCG/ACT AEPB, Inhale 1 puff into the lungs 2 (two) times daily., Disp: 1 each, Rfl: 5   lisdexamfetamine (VYVANSE) 40 MG capsule, Take 1 capsule (40 mg total) by mouth every morning., Disp: 30 capsule, Rfl: 0   loratadine (CLARITIN) 10 MG tablet, Take 1 tablet (10 mg total) by mouth daily., Disp: 90 tablet, Rfl: 3  No Known Allergies  I personally reviewed  active problem list, medication list, allergies, family history, social history, health maintenance with the patient/caregiver today.   ROS  Constitutional: Negative for fever or weight change.  Respiratory: Negative for cough and shortness of breath.   Cardiovascular: Negative for chest pain or palpitations.  Gastrointestinal: Negative for abdominal pain, no bowel changes.  Musculoskeletal: Negative for gait problem or joint swelling.  Skin: Negative for rash.  Neurological: Negative for dizziness or headache.  No other specific complaints in a complete review of systems (except as listed in HPI above).   Objective  Vitals:   06/13/21 1423  BP: 112/68  Pulse: 73  Resp: 16  Temp: 98.2 F (36.8 C)  SpO2: 98%  Weight:  139 lb (63 kg)  Height: 5\' 8"  (1.727 m)    Body mass index is 21.13 kg/m.  Physical Exam  Constitutional: Patient appears well-developed and well-nourished.  No distress.  HEENT: head atraumatic, normocephalic, pupils equal and reactive to light, neck supple Cardiovascular: Normal rate, regular rhythm and normal heart sounds.  No murmur heard. No BLE edema. Pulmonary/Chest: Effort normal and breath sounds normal. No respiratory distress. Abdominal: Soft.  There is no tenderness. Psychiatric: Patient has a normal mood and affect. behavior is normal. Judgment and thought content normal.    PHQ2/9: Depression screen [facility] 2/9 06/13/2021 03/09/2021 12/20/2020 12/08/2020 10/13/2020  Decreased Interest 2 1 0 0 0  Down, Depressed, Hopeless 2 0 0 1 1  PHQ - 2 Score 4 1 0 1 1  Altered sleeping 0 0 - 0 0  Tired, decreased energy 0 0 - 0 1  Change in appetite 0 0 - 1 1  Feeling bad or failure about yourself  0 0 - 1 1  Trouble concentrating 0 0 - 0 0  Moving slowly or fidgety/restless 0 0 - 1 0  Suicidal thoughts 0 0 - 0 0  PHQ-9 Score 4 1 - 4 4  Difficult doing work/chores - - - - -  Some recent data might be hidden    phq 9 is positive   Fall Risk: Fall Risk  06/13/2021 03/09/2021 12/20/2020 12/08/2020 10/13/2020  Falls in the past year? 0 0 0 0 0  Number falls in past yr: 0 0 0 0 0  Injury with Fall? 0 0 0 0 0  Risk for fall due to : No Fall Risks No Fall Risks - - -  Follow up Falls prevention discussed Falls prevention discussed - - -      Functional Status Survey: Is the patient deaf or have difficulty hearing?: No Does the patient have difficulty seeing, even when wearing glasses/contacts?: No Does the patient have difficulty concentrating, remembering, or making decisions?: No Does the patient have difficulty walking or climbing stairs?: No Does the patient have difficulty dressing or bathing?: No Does the patient have difficulty doing errands alone such as visiting a doctor's  office or shopping?: No    Assessment & Plan  1. Attention-deficit hyperactivity disorder, predominantly hyperactive type  - lisdexamfetamine (VYVANSE) 40 MG capsule; Take 1 capsule (40 mg total) by mouth every morning.  Dispense: 30 capsule; Refill: 0 - lisdexamfetamine (VYVANSE) 40 MG capsule; Take 1 capsule (40 mg total) by mouth every morning.  Dispense: 30 capsule; Refill: 0 - lisdexamfetamine (VYVANSE) 40 MG capsule; Take 1 capsule (40 mg total) by mouth every morning.  Dispense: 30 capsule; Refill: 0  2. High risk homosexual behavior  - RPR - HIV Antibody (routine testing w rflx) - Cytology (oral, anal,  urethral) ancillary only - Cytology (oral, anal, urethral) ancillary only  3. Asthma, moderate persistent, well-controlled  - fluticasone-salmeterol (ADVAIR) 100-50 MCG/ACT AEPB; Inhale 1 puff into the lungs 2 (two) times daily.  Dispense: 1 each; Refill: 5  4. Major depression, recurrent, chronic (HCC)  - escitalopram (LEXAPRO) 10 MG tablet; Take 1 tablet (10 mg total) by mouth daily.  Dispense: 90 tablet; Refill: 1 - buPROPion (WELLBUTRIN XL) 150 MG 24 hr tablet; Take 1 tablet (150 mg total) by mouth at bedtime.  Dispense: 90 tablet; Refill: 1  5. Routine screening for STI (sexually transmitted infection)  - emtricitabine-tenofovir (TRUVADA) 200-300 MG tablet; Take 1 tablet by mouth daily.  Dispense: 90 tablet; Refill: 1  6. High risk bisexual behavior

## 2021-06-13 ENCOUNTER — Ambulatory Visit: Payer: Medicaid Other | Admitting: Family Medicine

## 2021-06-13 ENCOUNTER — Encounter: Payer: Self-pay | Admitting: Family Medicine

## 2021-06-13 ENCOUNTER — Other Ambulatory Visit (HOSPITAL_COMMUNITY)
Admission: RE | Admit: 2021-06-13 | Discharge: 2021-06-13 | Disposition: A | Discharge status: Home / Self Care | Payer: Medicaid Other | Source: Ambulatory Visit | Attending: Family Medicine | Admitting: Family Medicine

## 2021-06-13 VITALS — BP 112/68 | HR 73 | Temp 98.2°F | Resp 16 | Ht 68.0 in | Wt 139.0 lb

## 2021-06-13 DIAGNOSIS — Z7252 High risk homosexual behavior: Secondary | ICD-10-CM | POA: Insufficient documentation

## 2021-06-13 DIAGNOSIS — Z7253 High risk bisexual behavior: Secondary | ICD-10-CM | POA: Diagnosis not present

## 2021-06-13 DIAGNOSIS — J4540 Moderate persistent asthma, uncomplicated: Secondary | ICD-10-CM

## 2021-06-13 DIAGNOSIS — F339 Major depressive disorder, recurrent, unspecified: Secondary | ICD-10-CM | POA: Diagnosis not present

## 2021-06-13 DIAGNOSIS — F901 Attention-deficit hyperactivity disorder, predominantly hyperactive type: Secondary | ICD-10-CM | POA: Diagnosis not present

## 2021-06-13 DIAGNOSIS — Z113 Encounter for screening for infections with a predominantly sexual mode of transmission: Secondary | ICD-10-CM | POA: Diagnosis not present

## 2021-06-13 MED ORDER — LISDEXAMFETAMINE DIMESYLATE 40 MG PO CAPS: 40.0000 mg | ORAL_CAPSULE | ORAL | 0 refills | Status: DC

## 2021-06-13 MED ORDER — ALBUTEROL SULFATE HFA 108 (90 BASE) MCG/ACT IN AERS: 2.0000 | INHALATION_SPRAY | Freq: Four times a day (QID) | RESPIRATORY_TRACT | 0 refills | Status: DC | PRN

## 2021-06-13 MED ORDER — BUPROPION HCL ER (XL) 150 MG PO TB24: 150.0000 mg | ORAL_TABLET | Freq: Every day | ORAL | 1 refills | Status: DC

## 2021-06-13 MED ORDER — ESCITALOPRAM OXALATE 10 MG PO TABS: 10.0000 mg | ORAL_TABLET | Freq: Every day | ORAL | 1 refills | Status: DC

## 2021-06-13 MED ORDER — EMTRICITABINE-TENOFOVIR DF 200-300 MG PO TABS: 1.0000 | ORAL_TABLET | Freq: Every day | ORAL | 1 refills | Status: DC

## 2021-06-13 MED ORDER — FLUTICASONE-SALMETEROL 100-50 MCG/ACT IN AEPB: 1.0000 | INHALATION_SPRAY | Freq: Two times a day (BID) | RESPIRATORY_TRACT | 5 refills | Status: DC

## 2021-06-14 LAB — RPR: RPR Ser Ql: NONREACTIVE

## 2021-06-14 LAB — HIV ANTIBODY (ROUTINE TESTING W REFLEX): HIV 1&2 Ab, 4th Generation: NONREACTIVE

## 2021-06-15 LAB — CYTOLOGY, (ORAL, ANAL, URETHRAL) ANCILLARY ONLY
Chlamydia: NEGATIVE
Chlamydia: NEGATIVE
Comment: NEGATIVE
Comment: NEGATIVE
Comment: NORMAL
Comment: NORMAL
Neisseria Gonorrhea: NEGATIVE
Neisseria Gonorrhea: NEGATIVE

## 2021-08-17 ENCOUNTER — Inpatient Hospital Stay: Payer: Medicaid Other | Admitting: Nurse Practitioner

## 2021-08-17 ENCOUNTER — Inpatient Hospital Stay: Payer: Medicaid Other | Attending: Oncology

## 2021-09-09 NOTE — Progress Notes (Deleted)
Name: Benjamin Pitts   MRN: [id number]    DOB: [birth year]   Date:09/09/2021       Progress Note  Subjective  Chief Complaint  Follow Up  HPI  ADD with learning disability: He is no longer going to [employer], he stopped going because he did not like the school environment . 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    MDD: he did not go see Dr. Shea Evans, states he had car problems and did not know where it was. He has been feeling better, phq 9 is positive. He states Lexapro and wellbutrin have been helpful, phq 9 today is down to 2, he asked me to continue prescribing his medications   History of STI/high risk : he has been with the same partner for the past 4 months but we have not checked for STI since. He uses condoms, he is bisexual, he has been taking Truvada and has been compliant. He is willing to get rechecked today   Malnutrition: he was 143 lbs in January  22 and dropped  down to 122 lbs but weight is gradually trending up and today is 139 lbs.   Thrombocytopenia:  sent him to hematologist and platelets back to normal, he was released from his care    Patient Active Problem List   Diagnosis Date Noted   Thrombocytopenia ([place]) 12/08/2020   Major depression, recurrent, chronic ([place]) 06/09/2020   Eczema 02/23/2015   ADHD (attention deficit hyperactivity disorder) 02/23/2015   Allergic rhinitis, seasonal 02/23/2015   Asthma, mild intermittent 02/23/2015   Learning problem 02/23/2015    No past surgical history on file.  Family History  Problem  Relation Age of Onset   Asthma Sister    Seizures Sister     Social History   Tobacco Use   Smoking status: Never   Smokeless tobacco: Never  Substance Use Topics   Alcohol use: No    Alcohol/week: 0.0 standard drinks     Current Outpatient Medications:    albuterol (VENTOLIN HFA) 108 (90 Base) MCG/ACT inhaler, Inhale 2 puffs into the lungs every 6 (six) hours as needed for wheezing or shortness of breath., Disp: 18 g, Rfl: 0   buPROPion (WELLBUTRIN XL) 150 MG 24 hr tablet, Take 1 tablet (150 mg total) by mouth at bedtime., Disp: 90 tablet, Rfl: 1   emtricitabine-tenofovir (TRUVADA) 200-300 MG tablet, Take 1 tablet by mouth daily., Disp: 90 tablet, Rfl: 1   escitalopram (LEXAPRO) 10 MG tablet, Take 1 tablet (10 mg total) by mouth daily., Disp: 90 tablet, Rfl: 1   fluticasone-salmeterol (ADVAIR) 100-50 MCG/ACT AEPB, Inhale 1 puff into the lungs 2 (two) times daily., Disp: 1 each, Rfl: 5   lisdexamfetamine (VYVANSE) 40 MG capsule, Take 1 capsule (40 mg total) by mouth every morning., Disp: 30 capsule, Rfl: 0   lisdexamfetamine (VYVANSE) 40 MG capsule, Take 1 capsule (40 mg total) by mouth every morning., Disp: 30 capsule, Rfl: 0   lisdexamfetamine (VYVANSE) 40 MG capsule,  Take 1 capsule (40 mg total) by mouth every morning., Disp: 30 capsule, Rfl: 0   loratadine (CLARITIN) 10 MG tablet, Take 1 tablet (10 mg total) by mouth daily., Disp: 90 tablet, Rfl: 3  No Known Allergies  I personally reviewed active problem list, medication list, allergies, family history, social history, health maintenance with the patient/caregiver today.   ROS  ***  Objective  There were no vitals filed for this visit.  There is no height or weight on file to calculate BMI.  Physical Exam ***  Recent Results (from the past 2160 hour(s))  Cytology (oral, anal, urethral) ancillary only     Status: None   Collection Time: 06/13/21  3:16 PM  Result Value Ref Range   Neisseria Gonorrhea Negative     Chlamydia Negative    Comment Normal Reference Ranger Chlamydia - Negative    Comment      Normal Reference Range Neisseria Gonorrhea - Negative  Cytology (oral, anal, urethral) ancillary only     Status: None   Collection Time: 06/13/21  3:16 PM  Result Value Ref Range   Neisseria Gonorrhea Negative    Chlamydia Negative    Comment Normal Reference Ranger Chlamydia - Negative    Comment      Normal Reference Range Neisseria Gonorrhea - Negative  RPR     Status: None   Collection Time: 06/13/21  3:26 PM  Result Value Ref Range   RPR Ser Ql NON-REACTIVE NON-REACTIVE  HIV Antibody (routine testing w rflx)     Status: None   Collection Time: 06/13/21  3:26 PM  Result Value Ref Range   HIV 1&2 Ab, 4th Generation NON-REACTIVE NON-REACTIVE    Comment: HIV-1 antigen and HIV-1/HIV-2 antibodies were not detected. There is no laboratory evidence of HIV infection. Marland Kitchen PLEASE NOTE: This information has been disclosed to you from records whose confidentiality may be protected by state law.  If your state requires such protection, then the state law prohibits you from making any further disclosure of the information without the specific written consent of the person to whom it pertains, or as otherwise permitted by law. A general authorization for the release of medical or other information is NOT sufficient for this purpose. . For additional information please refer to http://education.questdiagnostics.com/faq/FAQ106 (This link is being provided for informational/ educational purposes only.) . Marland Kitchen The performance of this assay has not been clinically validated in patients less than 24 years old. .     PHQ2/9:    06/13/2021    2:23 PM 03/09/2021    2:46 PM 12/20/2020    2:28 PM 12/08/2020    2:22 PM 10/13/2020    1:47 PM  Depression screen PHQ 2/9  Decreased Interest 2 1 0 0 0  Down, Depressed, Hopeless 2 0 0 1 1  PHQ - 2 Score 4 1 0 1 1  Altered sleeping 0 0  0 0  Tired, decreased  energy 0 0  0 1  Change in appetite 0 0  1 1  Feeling bad or failure about yourself  0 0  1 1  Trouble concentrating 0 0  0 0  Moving slowly or fidgety/restless 0 0  1 0  Suicidal thoughts 0 0  0 0  PHQ-9 Score 4 1  4 4     phq 9 is {gen pos [id number]   Fall Risk:    06/13/2021    2:22 PM 03/09/2021    2:46 PM 12/20/2020    2:28 PM  12/08/2020    2:22 PM 10/13/2020    1:47 PM  Fall Risk   Falls in the past year? 0 0 0 0 0  Number falls in past yr: 0 0 0 0 0  Injury with Fall? 0 0 0 0 0  Risk for fall due to : No Fall Risks No Fall Risks     Follow up Falls prevention discussed Falls prevention discussed         Functional Status Survey:      Assessment & Plan  *** There are no diagnoses linked to this encounter.

## 2021-09-12 ENCOUNTER — Ambulatory Visit: Payer: Medicaid Other | Admitting: Family Medicine

## 2021-09-20 ENCOUNTER — Ambulatory Visit: Payer: Self-pay

## 2021-09-20 ENCOUNTER — Ambulatory Visit: Payer: Medicaid Other | Admitting: Family Medicine

## 2021-09-20 ENCOUNTER — Encounter: Payer: Self-pay | Admitting: Family Medicine

## 2021-09-20 VITALS — BP 106/72 | HR 84 | Temp 98.1°F | Resp 16 | Ht 67.0 in | Wt 139.6 lb

## 2021-09-20 DIAGNOSIS — R369 Urethral discharge, unspecified: Secondary | ICD-10-CM | POA: Diagnosis not present

## 2021-09-20 DIAGNOSIS — Z113 Encounter for screening for infections with a predominantly sexual mode of transmission: Secondary | ICD-10-CM

## 2021-09-20 DIAGNOSIS — Z7252 High risk homosexual behavior: Secondary | ICD-10-CM | POA: Diagnosis not present

## 2021-09-20 DIAGNOSIS — R300 Dysuria: Secondary | ICD-10-CM | POA: Diagnosis not present

## 2021-09-20 LAB — POCT URINALYSIS DIPSTICK (MANUAL)
Nitrite, UA: POSITIVE — AB
Poct Bilirubin: NEGATIVE
Poct Blood: 50 — AB
Poct Glucose: NORMAL mg/dL
Poct Ketones: NEGATIVE
Poct Protein: 30 mg/dL — AB
Poct Urobilinogen: 1 mg/dL — AB
Spec Grav, UA: 1.02 (ref 1.010–1.025)
pH, UA: 7 (ref 5.0–8.0)

## 2021-09-20 MED ORDER — CEFTRIAXONE SODIUM 500 MG IJ SOLR
500.0000 mg | Freq: Once | INTRAMUSCULAR | Status: AC
  Administered 2021-09-20: 500 mg via INTRAMUSCULAR

## 2021-09-20 MED ORDER — DOXYCYCLINE HYCLATE 100 MG PO TABS: 100.0000 mg | ORAL_TABLET | Freq: Two times a day (BID) | ORAL | 0 refills | Status: AC

## 2021-09-20 NOTE — Patient Instructions (Signed)
Coverage for infections that may cause your symptoms include the injection here today - ceftriaxone - which covers gonorrhea, and then doxycycline which covers chlamydia, urinary tract infections and prostatitis. ? ?Please complete the medicines ? ?Follow up if symptoms are not relieved in 1 week ? ?We will also contact you regarding the other lab results and adjust treatment as needed ? ?Urethritis, Adult ? ?Urethritis is a swelling (inflammation) of the urethra. The urethra is the tube that drains urine from the bladder. It is important to get treatment for this condition early. Delayed treatment may lead to complications, such as an infection in the urinary tract (ureters, kidneys, and bladder). ?What are the causes? ?This condition may be caused by: ?Germs that are spread through sexual contact. This is the leading cause of urethritis. This may include bacterial or viral infections. ?Injury to the urethra. This can happen after a thin, flexible tube (catheter) is inserted into the urethra to drain urine, or after medical instruments or foreign bodies are inserted into the area. ?Chemical irritation. This may include contact with spermicide or prolonged contact with chemicals in bubble bath, shampoo, or perfumed soaps. ?A disease that causes inflammation. This is rare. ?What increases the risk? ?The following factors may make you more likely to develop this condition: ?Having sex without using a condom. ?Having multiple sexual partners. ?Having poor hygiene. ?What are the signs or symptoms? ?Symptoms of this condition include: ?Pain with urination. ?Frequent urination. ?An urgent need to urinate. ?Itching and pain in the vagina or penis. ?Discharge or bleeding coming from the penis. ?Most women have no symptoms. ?How is this diagnosed? ?This condition may be diagnosed based on: ?Your symptoms. ?Your medical history. ?A physical exam. ?Tests may also be done. These may include: ?Urine tests. ?Swabs from the  urethra. ?How is this treated? ?Treatment for this condition depends on the cause. ?Urethritis caused by a bacterial infection is treated with antibiotic medicine. ?Sexual partners must also be treated. ?Follow these instructions at home: ?Medicines ?Take over-the-counter and prescription medicines only as told by your health care provider. ?If you were prescribed an antibiotic, take it as told by your health care provider. Do not stop taking the antibiotic even if you start to feel better. ?Lifestyle ?Avoid using perfumed soaps, bubble bath, and shampoo when you bathe or shower. Rinse the vaginal area after bathing. ?Wear cotton underwear. Not wearing underwear when going to sleep can help. ?Make sure to wipe from front to back after using the toilet if you are male. ?Do not have sex until your health care provider approves. When you do have sex, be sure to practice safe sex. ?Any sexual partners you have had in the past 60 days should be treated. ?General instructions ?Drink enough fluid to keep your urine pale yellow. ?It is up to you to get your test results. Ask your health care provider, or the department that is doing the test, when your results will be ready. ?Keep all follow-up visits. This is important. ?Get tested again 3 months after treatment to make sure the infection is gone. It is important that your sexual partner also gets tested again. ?Contact a health care provider if: ?Your symptoms have not improved after 3 days. ?Your symptoms get worse. ?You have eye redness or pain. ?You develop abdominal pain or pelvic pain (in females). ?You develop joint pain or a rash. ?You have a fever or chills. ?Get help right away if: ?You have severe pain in the belly, back, or  side. ?You vomit repeatedly. ?Summary ?Urethritis is a swelling (inflammation) of the urethra. ?Germs that are spread through sexual contact are the most common cause of this condition. ?It is important to get treatment for this condition  early. Delayed treatment may lead to complications. ?Treatment for this condition depends on the cause. Any sexual partners must also be treated. ?This information is not intended to replace advice given to you by your health care provider. Make sure you discuss any questions you have with your health care provider. ?Document Revised: 12/07/2019 Document Reviewed: 12/07/2019 ?Elsevier Patient Education ? 2023 [employer] Inc[employer] ? ?

## 2021-09-20 NOTE — Telephone Encounter (Signed)
?  Chief Complaint: penile discharge ?Symptoms: milky or creamy penile discharge ?Frequency: last Friday ?Pertinent Negatives: Patient denies back or flank pain, fever, blood in urine or scrotal pain ?Disposition: [] ED /[] Urgent Care (no appt availability in office) / [x] Appointment(In office/virtual)/ []  [place] [facility]/ [] Home Care/ [] Refused Recommended Disposition /[] [place] [facility]/ []  Follow-up with PCP ?Additional Notes: No available appt's with PCP- pt has appt today with PA ? ? ? ?Reason for Disposition ? Pus (white, yellow) or bloody discharge from end of penis ? ?Answer Assessment - Initial Assessment Questions ?1. SEVERITY: "How bad is the pain?"  (e.g., Scale 1-10; mild, moderate, or severe) ?  - MILD (1-3): complains slightly about urination hurting ?  - MODERATE (4-7): interferes with normal activities   ?  - SEVERE (8-10): excruciating, unwilling or unable to urinate because of the pain  ?    mild ?2. FREQUENCY: "How many times have you had painful urination today?"  ?    Started last Friday  ?3. PATTERN: "Is pain present every time you urinate or just sometimes?"  ?    Every time ?4. ONSET: "When did the painful urination start?"  ?    Last Friday ?5. FEVER: "Do you have a fever?" If Yes, ask: "What is your temperature, how was it measured, and when did it start?" ?    no ?6. PAST UTI: "Have you had a urine infection before?" If Yes, ask: "When was the last time?" and "What happened that time?"  ?    no ?7. CAUSE: "What do you think is causing the painful urination?"  ?    unsure ?8. OTHER SYMPTOMS: "Do you have any other symptoms?" (e.g., flank pain, penile discharge, scrotal pain, blood in urine) ?    Penile discharge ? ?Protocols used: Urination Pain - Male-A-AH ? ?

## 2021-09-20 NOTE — Progress Notes (Signed)
? ? ?Patient ID: Benjamin BolusMonquaille R Pitts, male    DOB: 17-Feb-1996, 26 y.o.   MRN: [id number] ? ?PCP: Alba CorySowles, Krichna, MD ? ?Chief Complaint  ?Patient presents with  ? Dysuria  ?  With penile discharge  ? Urinary Tract Infection  ? ? ?Subjective:  ? ?Benjamin Pitts is a 26 y.o. male, presents to clinic with CC of the following: ? ?HPI  ?Patient presents for several days of penile discharge and dysuria with having to strain to urinate.  He denies hematuria, abdominal pain, flank pain, nausea, vomiting or fever ?He reports that in the last 6 to 12 months that he has only had 1 male partner, gets tested frequently, recent testing was negative.  He does not suspect that his male partner has any other sexual partners that he did move out of the area a few months ago. ?Penile discharge is purulent leave stains on clothing. ?He denies testicular or scrotal pain or swelling, denies groin discomfort or lymphadenopathy ? ?Patient Active Problem List  ? Diagnosis Date Noted  ? Thrombocytopenia (HCC) 12/08/2020  ? Major depression, recurrent, chronic (HCC) 06/09/2020  ? Eczema 02/23/2015  ? ADHD (attention deficit hyperactivity disorder) 02/23/2015  ? Allergic rhinitis, seasonal 02/23/2015  ? Asthma, mild intermittent 02/23/2015  ? Learning problem 02/23/2015  ? ? ? ? ?Current Outpatient Medications:  ?  albuterol (VENTOLIN HFA) 108 (90 Base) MCG/ACT inhaler, Inhale 2 puffs into the lungs every 6 (six) hours as needed for wheezing or shortness of breath., Disp: 18 g, Rfl: 0 ?  buPROPion (WELLBUTRIN XL) 150 MG 24 hr tablet, Take 1 tablet (150 mg total) by mouth at bedtime., Disp: 90 tablet, Rfl: 1 ?  emtricitabine-tenofovir (TRUVADA) 200-300 MG tablet, Take 1 tablet by mouth daily., Disp: 90 tablet, Rfl: 1 ?  escitalopram (LEXAPRO) 10 MG tablet, Take 1 tablet (10 mg total) by mouth daily., Disp: 90 tablet, Rfl: 1 ?  fluticasone-salmeterol (ADVAIR) 100-50 MCG/ACT AEPB, Inhale 1 puff into the lungs 2 (two) times daily., Disp: 1 each,  Rfl: 5 ?  lisdexamfetamine (VYVANSE) 40 MG capsule, Take 1 capsule (40 mg total) by mouth every morning., Disp: 30 capsule, Rfl: 0 ?  lisdexamfetamine (VYVANSE) 40 MG capsule, Take 1 capsule (40 mg total) by mouth every morning., Disp: 30 capsule, Rfl: 0 ?  lisdexamfetamine (VYVANSE) 40 MG capsule, Take 1 capsule (40 mg total) by mouth every morning., Disp: 30 capsule, Rfl: 0 ?  loratadine (CLARITIN) 10 MG tablet, Take 1 tablet (10 mg total) by mouth daily., Disp: 90 tablet, Rfl: 3 ? ? ?No Known Allergies ? ? ?Social History  ? ?Tobacco Use  ? Smoking status: Never  ? Smokeless tobacco: Never  ?Vaping Use  ? Vaping Use: Never used  ?Substance Use Topics  ? Alcohol use: No  ?  Alcohol/week: 0.0 standard drinks  ? Drug use: No  ?  ? ? ?Chart Review Today: ?I personally reviewed active problem list, medication list, allergies, family history, social history, health maintenance, notes from last encounter, lab results, imaging with the patient/caregiver today. ? ? ?Review of Systems  ?Constitutional: Negative.   ?HENT: Negative.    ?Eyes: Negative.   ?Respiratory: Negative.    ?Cardiovascular: Negative.   ?Gastrointestinal: Negative.   ?Endocrine: Negative.   ?Genitourinary: Negative.   ?Musculoskeletal: Negative.   ?Skin: Negative.   ?Allergic/Immunologic: Negative.   ?Neurological: Negative.   ?Hematological: Negative.   ?Psychiatric/Behavioral: Negative.    ?All other systems reviewed and are negative. ? ?   ?  Objective:  ? ?Vitals:  ? 09/20/21 1443  ?BP: 106/72  ?Pulse: 84  ?Resp: 16  ?Temp: 98.1 ?F (36.7 ?C)  ?TempSrc: Oral  ?SpO2: 96%  ?Weight: 139 lb 9.6 oz (63.3 kg)  ?Height: 5\' 7"  (1.702 m)  ?  ?Body mass index is 21.86 kg/m?. ? ?Physical Exam ?Vitals and nursing note reviewed.  ?Constitutional:   ?   General: He is not in acute distress. ?   Appearance: Normal appearance. He is well-developed. He is not ill-appearing, toxic-appearing or diaphoretic.  ?HENT:  ?   Head: Normocephalic and atraumatic.  ?   Right  Ear: External ear normal.  ?   Left Ear: External ear normal.  ?Eyes:  ?   General:     ?   Right eye: No discharge.     ?   Left eye: No discharge.  ?   Conjunctiva/sclera: Conjunctivae normal.  ?Neck:  ?   Trachea: No tracheal deviation.  ?Cardiovascular:  ?   Rate and Rhythm: Normal rate and regular rhythm.  ?   Pulses: Normal pulses.  ?   Heart sounds: Normal heart sounds.  ?Pulmonary:  ?   Effort: Pulmonary effort is normal. No respiratory distress.  ?   Breath sounds: Normal breath sounds. No stridor.  ?Abdominal:  ?   General: Bowel sounds are normal. There is no distension.  ?   Palpations: Abdomen is soft.  ?   Tenderness: There is no abdominal tenderness. There is no right CVA tenderness, left CVA tenderness, guarding or rebound.  ?Genitourinary: ?   Comments: Declines GU exam ?Musculoskeletal:     ?   General: Normal range of motion.  ?Skin: ?   General: Skin is warm and dry.  ?   Findings: No rash.  ?Neurological:  ?   Mental Status: He is alert.  ?   Motor: No abnormal muscle tone.  ?   Coordination: Coordination normal.  ?Psychiatric:     ?   Mood and Affect: Mood normal.     ?   Behavior: Behavior normal.  ?  ?Results for orders placed or performed in visit on 09/20/21  ?HIV Antibody (routine testing w rflx)  ?Result Value Ref Range  ? HIV 1&2 Ab, 4th Generation NON-REACTIVE NON-REACTIVE  ?POCT Urinalysis Dip Manual  ?Result Value Ref Range  ? Spec Grav, UA 1.020 1.010 - 1.025  ? pH, UA 7.0 5.0 - 8.0  ? Leukocytes, UA Large (3+) (A) Negative  ? Nitrite, UA Positive (A) Negative  ? Poct Protein +30 (A) Negative, trace mg/dL  ? Poct Glucose Normal Normal mg/dL  ? Poct Ketones Negative Negative  ? Poct Urobilinogen =1 (A) Normal mg/dL  ? Poct Bilirubin Negative Negative  ? Poct Blood =50 (A) Negative, trace  ? ? ? ?Results for orders placed or performed in visit on 06/13/21  ?RPR  ?Result Value Ref Range  ? RPR Ser Ql NON-REACTIVE NON-REACTIVE  ?HIV Antibody (routine testing w rflx)  ?Result Value Ref  Range  ? HIV 1&2 Ab, 4th Generation NON-REACTIVE NON-REACTIVE  ?Cytology (oral, anal, urethral) ancillary only  ?Result Value Ref Range  ? Neisseria Gonorrhea Negative   ? Chlamydia Negative   ? Comment Normal Reference Ranger Chlamydia - Negative   ? Comment    ?  Normal Reference Range Neisseria Gonorrhea - Negative  ?Cytology (oral, anal, urethral) ancillary only  ?Result Value Ref Range  ? Neisseria Gonorrhea Negative   ? Chlamydia Negative   ? Comment  Normal Reference Ranger Chlamydia - Negative   ? Comment    ?  Normal Reference Range Neisseria Gonorrhea - Negative  ? ? ?   ?Assessment & Plan:  ? ?  ICD-10-CM   ?1. Penile discharge  R36.9 POCT Urinalysis Dip Manual  ?  Urine Culture  ?  Urine cytology ancillary only  ?  RPR  ?  HIV Antibody (routine testing w rflx)  ?  cefTRIAXone (ROCEPHIN) injection 500 mg  ?  doxycycline (VIBRA-TABS) 100 MG tablet  ? Test for all STDs, will give Rocephin injection here and send patient home with doxycycline to cover chlamydia/urethritis and possible prostatitis  ?  ?2. High risk homosexual behavior  Z72.52 POCT Urinalysis Dip Manual  ?  Urine Culture  ?  Urine cytology ancillary only  ?  RPR  ?  HIV Antibody (routine testing w rflx)  ?  cefTRIAXone (ROCEPHIN) injection 500 mg  ?  doxycycline (VIBRA-TABS) 100 MG tablet  ? pt reports only one recent male partner and frequent STD testing, is on prep, no known STD exposure  ?  ?3. Dysuria  R30.0 POCT Urinalysis Dip Manual  ?  Urine Culture  ?  Urine cytology ancillary only  ?  cefTRIAXone (ROCEPHIN) injection 500 mg  ?  doxycycline (VIBRA-TABS) 100 MG tablet  ? UA positive -we will follow culture and adjust antibiotics as needed  ?  ?4. Screen for STD (sexually transmitted disease)  Z11.3 Urine cytology ancillary only  ?  RPR  ?  HIV Antibody (routine testing w rflx)  ?  cefTRIAXone (ROCEPHIN) injection 500 mg  ?  doxycycline (VIBRA-TABS) 100 MG tablet  ?  ? ?Pending results pt may need to return for test for cure ?Plan was  reviewed with pt and he was in agreement for covering with shot in clinic, starting doxycycline and adding other meds as needed pending lab results. ? ? ?Danelle Berry, PA-C ?09/20/21 2:49 PM ? ?

## 2021-09-22 LAB — URINE CYTOLOGY ANCILLARY ONLY
Chlamydia: NEGATIVE
Comment: NEGATIVE
Comment: NEGATIVE
Comment: NORMAL
Neisseria Gonorrhea: POSITIVE — AB
Trichomonas: NEGATIVE

## 2021-09-22 LAB — URINE CULTURE
MICRO NUMBER:: 13372422
SPECIMEN QUALITY:: ADEQUATE

## 2021-09-22 LAB — RPR: RPR Ser Ql: NONREACTIVE

## 2021-09-22 LAB — HIV ANTIBODY (ROUTINE TESTING W REFLEX): HIV 1&2 Ab, 4th Generation: NONREACTIVE

## 2021-10-25 NOTE — Progress Notes (Unsigned)
Name: Benjamin Pitts   MRN: [id number]    DOB: 08/31/1995   Date:10/26/2021       Progress Note  Subjective  Chief Complaint  Follow up   HPI  ADD with learning disability: He is no longer going to [facility], he stopped going because he did not like the school environment . Working part time at [employer] and feels overwhelmed since co-workers don't do their work, when at home always feeling tired and sleeping most of the day.    AR:he is doing well at this time, denies sneezing, rhinorrhea or nasal congestion.   Asthma: He denies cough, sob or wheezing , but he has been found wheezing during exams and was given Advair and he takes it prn    MDD: he did not go see Dr. Elna Breslow, states he had car problems and did not know where it was. He has been feeling better, phq 9 is positive. He states Lexapro and wellbutrin have been helpful, phq 9 today is up again, weight is dropping again and he has been tired and lack of energy also sleeping most of the day   History of STI/high risk : he has a new sexual partner, broke up with previous partner Feb 2023 , new partner since May 2023 . He uses condoms, he is bisexual, he has been taking Truvada and has been compliant. We will recheck labs today   Malnutrition: he was 143 lbs in January  22 and dropped  down to 122 lbs but weight is gradually trending up and but it was 139 lbs last visit and is down to 135 lbs again.   Thrombocytopenia:  sent him to hematologist and platelets back to normal, he was released from their care. We will recheck yearly    Patient Active Problem List   Diagnosis Date Noted   Thrombocytopenia (HCC) 12/08/2020   Major depression, recurrent, chronic (HCC) 06/09/2020   Eczema 02/23/2015   ADHD (attention deficit hyperactivity disorder) 02/23/2015   Allergic rhinitis, seasonal 02/23/2015   Asthma, mild intermittent 02/23/2015   Learning problem 02/23/2015    No past surgical history on file.  Family History  Problem  Relation Age of Onset   Asthma Sister    Seizures Sister     Social History   Tobacco Use   Smoking status: Never   Smokeless tobacco: Never  Substance Use Topics   Alcohol use: No    Alcohol/week: 0.0 standard drinks of alcohol     Current Outpatient Medications:    albuterol (VENTOLIN HFA) 108 (90 Base) MCG/ACT inhaler, Inhale 2 puffs into the lungs every 6 (six) hours as needed for wheezing or shortness of breath., Disp: 18 g, Rfl: 0   buPROPion (WELLBUTRIN XL) 150 MG 24 hr tablet, Take 1 tablet (150 mg total) by mouth at bedtime., Disp: 90 tablet, Rfl: 1   emtricitabine-tenofovir (TRUVADA) 200-300 MG tablet, Take 1 tablet by mouth daily., Disp: 90 tablet, Rfl: 1   escitalopram (LEXAPRO) 10 MG tablet, Take 1 tablet (10 mg total) by mouth daily., Disp: 90 tablet, Rfl: 1   fluticasone-salmeterol (ADVAIR) 100-50 MCG/ACT AEPB, Inhale 1 puff into the lungs 2 (two) times daily., Disp: 1 each, Rfl: 5   lisdexamfetamine (VYVANSE) 40 MG capsule, Take 1 capsule (40 mg total) by mouth every morning., Disp: 30 capsule, Rfl: 0   lisdexamfetamine (VYVANSE) 40 MG capsule, Take 1 capsule (40 mg total) by mouth every morning., Disp: 30 capsule, Rfl: 0   lisdexamfetamine (VYVANSE) 40 MG capsule,  Take 1 capsule (40 mg total) by mouth every morning., Disp: 30 capsule, Rfl: 0   loratadine (CLARITIN) 10 MG tablet, Take 1 tablet (10 mg total) by mouth daily., Disp: 90 tablet, Rfl: 3  No Known Allergies  I personally reviewed active problem list, medication list, allergies, family history, social history, health maintenance with the patient/caregiver today.   ROS  Constitutional: Negative for fever or weight change.  Respiratory: Negative for cough and shortness of breath.   Cardiovascular: Negative for chest pain or palpitations.  Gastrointestinal: Negative for abdominal pain, no bowel changes.  Musculoskeletal: Negative for gait problem or joint swelling.  Skin: Negative for rash.  Neurological:  Negative for dizziness or headache.  No other specific complaints in a complete review of systems (except as listed in HPI above).   Objective  Vitals:   10/26/21 1317  BP: 102/68  Pulse: 74  Resp: 16  SpO2: 96%  Weight: 135 lb (61.2 kg)  Height: 5\' 8"  (1.727 m)    Body mass index is 20.53 kg/m.  Physical Exam  Constitutional: Patient appears well-developed and well-nourished. No distress.  HEENT: head atraumatic, normocephalic, pupils equal and reactive to light, neck supple Cardiovascular: Normal rate, regular rhythm and normal heart sounds.  No murmur heard. No BLE edema. Pulmonary/Chest: Effort normal and breath sounds normal. No respiratory distress. Abdominal: Soft.  There is no tenderness. Psychiatric: Patient has a normal mood and affect. behavior is normal. Judgment and thought content normal.   Recent Results (from the past 2160 hour(s))  Urine cytology ancillary only     Status: Abnormal   Collection Time: 09/20/21  2:56 PM  Result Value Ref Range   Trichomonas Negative    Chlamydia Negative    Neisseria Gonorrhea Positive (A)    Comment Normal Reference Ranger Chlamydia - Negative    Comment Normal Reference Range Trichomonas - Negative    Comment      Normal Reference Range Neisseria Gonorrhea - Negative  POCT Urinalysis Dip Manual     Status: Abnormal   Collection Time: 09/20/21  3:03 PM  Result Value Ref Range   Spec Grav, UA 1.020 1.010 - 1.025   pH, UA 7.0 5.0 - 8.0   Leukocytes, UA Large (3+) (A) Negative   Nitrite, UA Positive (A) Negative   Poct Protein +30 (A) Negative, trace mg/dL   Poct Glucose Normal Normal mg/dL   Poct Ketones Negative Negative   Poct Urobilinogen =1 (A) Normal mg/dL   Poct Bilirubin Negative Negative   Poct Blood =50 (A) Negative, trace  Urine Culture     Status: None   Collection Time: 09/20/21  3:42 PM   Specimen: Urine  Result Value Ref Range   MICRO NUMBER: [id number]    SPECIMEN QUALITY: Adequate    Sample Source  URINE    STATUS: FINAL    ISOLATE 1:      Less than 10,000 CFU/mL of single Gram positive organism isolated. No further testing will be performed. If clinically indicated, recollection using a method to minimize contamination, with prompt transfer to Urine Culture Transport Tube, is recommended.  RPR     Status: None   Collection Time: 09/20/21  3:42 PM  Result Value Ref Range   RPR Ser Ql NON-REACTIVE NON-REACTIVE  HIV Antibody (routine testing w rflx)     Status: None   Collection Time: 09/20/21  3:42 PM  Result Value Ref Range   HIV 1&2 Ab, 4th Generation NON-REACTIVE NON-REACTIVE    Comment:  HIV-1 antigen and HIV-1/HIV-2 antibodies were not detected. There is no laboratory evidence of HIV infection. Marland Kitchen PLEASE NOTE: This information has been disclosed to you from records whose confidentiality may be protected by state law.  If your state requires such protection, then the state law prohibits you from making any further disclosure of the information without the specific written consent of the person to whom it pertains, or as otherwise permitted by law. A general authorization for the release of medical or other information is NOT sufficient for this purpose. . For additional information please refer to http://education.quest[employer].com/faq/FAQ106 (This link is being provided for informational/ educational purposes only.) . Marland Kitchen The performance of this assay has not been clinically validated in patients less than 38 years old. .      PHQ2/9:    10/26/2021    1:34 PM 09/20/2021    2:45 PM 06/13/2021    2:23 PM 03/09/2021    2:46 PM 12/20/2020    2:28 PM  Depression screen PHQ 2/9  Decreased Interest 2 1 2 1  0  Down, Depressed, Hopeless 0 2 2 0 0  PHQ - 2 Score 2 3 4 1  0  Altered sleeping 3 1 0 0   Tired, decreased energy 0 1 0 0   Change in appetite 0 2 0 0   Feeling bad or failure about yourself  0 1 0 0   Trouble concentrating 0 0 0 0   Moving slowly or  fidgety/restless 0 2 0 0   Suicidal thoughts 0 1 0 0   PHQ-9 Score 5 11 4 1    Difficult doing work/chores  Not difficult at all       phq 9 is positive   Fall Risk:    10/26/2021    1:17 PM 09/20/2021    2:45 PM 06/13/2021    2:22 PM 03/09/2021    2:46 PM 12/20/2020    2:28 PM  Fall Risk   Falls in the past year? 0 0 0 0 0  Number falls in past yr: 0  0 0 0  Injury with Fall? 0  0 0 0  Risk for fall due to : No Fall Risks No Fall Risks No Fall Risks No Fall Risks   Follow up Falls prevention discussed Falls prevention discussed Falls prevention discussed Falls prevention discussed       Functional Status Survey: Is the patient deaf or have difficulty hearing?: No Does the patient have difficulty seeing, even when wearing glasses/contacts?: No Does the patient have difficulty concentrating, remembering, or making decisions?: Yes Does the patient have difficulty walking or climbing stairs?: No Does the patient have difficulty dressing or bathing?: No Does the patient have difficulty doing errands alone such as visiting a doctor's office or shopping?: No    Assessment & Plan  1. Asthma, moderate persistent, well-controlled  - fluticasone-salmeterol (ADVAIR) 100-50 MCG/ACT AEPB; Inhale 1 puff into the lungs 2 (two) times daily.  Dispense: 1 each; Refill: 5  2. Attention-deficit hyperactivity disorder, predominantly hyperactive type  - lisdexamfetamine (VYVANSE) 50 MG capsule; Take 1 capsule (50 mg total) by mouth every morning.  Dispense: 30 capsule; Refill: 0  3. Thrombocytopenia (HCC)  - CBC with Differential/Platelet  4. Other fatigue  Explained fatigue may be secondary to depression but we will check some labs today  - CBC with Differential/Platelet - COMPLETE METABOLIC PANEL WITH GFR - TSH - Vitamin B12  5. Major depression, recurrent, chronic (HCC)  - Ambulatory referral to Psychiatry -  buPROPion (WELLBUTRIN XL) 150 MG 24 hr tablet; Take 1 tablet (150 mg  total) by mouth at bedtime.  Dispense: 90 tablet; Refill: 1 - escitalopram (LEXAPRO) 10 MG tablet; Take 1 tablet (10 mg total) by mouth daily.  Dispense: 90 tablet; Refill: 1  6. Routine screening for STI (sexually transmitted infection)  - emtricitabine-tenofovir (TRUVADA) 200-300 MG tablet; Take 1 tablet by mouth daily.  Dispense: 90 tablet; Refill: 1  7. High risk bisexual behavior  - emtricitabine-tenofovir (TRUVADA) 200-300 MG tablet; Take 1 tablet by mouth daily.  Dispense: 90 tablet; Refill: 1

## 2021-10-26 ENCOUNTER — Encounter: Payer: Self-pay | Admitting: Family Medicine

## 2021-10-26 ENCOUNTER — Ambulatory Visit: Payer: Medicaid Other | Admitting: Family Medicine

## 2021-10-26 ENCOUNTER — Other Ambulatory Visit (HOSPITAL_COMMUNITY)
Admission: RE | Admit: 2021-10-26 | Discharge: 2021-10-26 | Disposition: A | Discharge status: Home / Self Care | Payer: Medicaid Other | Source: Ambulatory Visit | Attending: Family Medicine | Admitting: Family Medicine

## 2021-10-26 VITALS — BP 102/68 | HR 74 | Resp 16 | Ht 68.0 in | Wt 135.0 lb

## 2021-10-26 DIAGNOSIS — F901 Attention-deficit hyperactivity disorder, predominantly hyperactive type: Secondary | ICD-10-CM

## 2021-10-26 DIAGNOSIS — D696 Thrombocytopenia, unspecified: Secondary | ICD-10-CM

## 2021-10-26 DIAGNOSIS — R5383 Other fatigue: Secondary | ICD-10-CM

## 2021-10-26 DIAGNOSIS — Z113 Encounter for screening for infections with a predominantly sexual mode of transmission: Secondary | ICD-10-CM

## 2021-10-26 DIAGNOSIS — Z7253 High risk bisexual behavior: Secondary | ICD-10-CM | POA: Diagnosis not present

## 2021-10-26 DIAGNOSIS — F339 Major depressive disorder, recurrent, unspecified: Secondary | ICD-10-CM | POA: Diagnosis not present

## 2021-10-26 DIAGNOSIS — J45909 Unspecified asthma, uncomplicated: Secondary | ICD-10-CM | POA: Insufficient documentation

## 2021-10-26 DIAGNOSIS — J4540 Moderate persistent asthma, uncomplicated: Secondary | ICD-10-CM

## 2021-10-26 MED ORDER — EMTRICITABINE-TENOFOVIR DF 200-300 MG PO TABS: 1.0000 | ORAL_TABLET | Freq: Every day | ORAL | 1 refills | Status: DC

## 2021-10-26 MED ORDER — ALBUTEROL SULFATE HFA 108 (90 BASE) MCG/ACT IN AERS: 2.0000 | INHALATION_SPRAY | Freq: Four times a day (QID) | RESPIRATORY_TRACT | 0 refills | Status: DC | PRN

## 2021-10-26 MED ORDER — LISDEXAMFETAMINE DIMESYLATE 50 MG PO CAPS: 50.0000 mg | ORAL_CAPSULE | ORAL | 0 refills | Status: DC

## 2021-10-26 MED ORDER — BUPROPION HCL ER (XL) 150 MG PO TB24: 150.0000 mg | ORAL_TABLET | Freq: Every day | ORAL | 1 refills | Status: DC

## 2021-10-26 MED ORDER — FLUTICASONE-SALMETEROL 100-50 MCG/ACT IN AEPB: 1.0000 | INHALATION_SPRAY | Freq: Two times a day (BID) | RESPIRATORY_TRACT | 5 refills | Status: DC

## 2021-10-26 MED ORDER — ESCITALOPRAM OXALATE 10 MG PO TABS: 10.0000 mg | ORAL_TABLET | Freq: Every day | ORAL | 1 refills | Status: DC

## 2021-10-26 NOTE — Addendum Note (Signed)
Addended by: Benay Pike on: 10/26/2021 02:11 PM   Modules accepted: Orders

## 2021-10-26 NOTE — Patient Instructions (Signed)
[facility]: Dr. Elna Breslow (Ph: [phone])

## 2021-10-27 LAB — COMPLETE METABOLIC PANEL WITH GFR
AG Ratio: 1.7 (calc) (ref 1.0–2.5)
ALT: 11 U/L (ref 9–46)
AST: 16 U/L (ref 10–40)
Albumin: 4.6 g/dL (ref 3.6–5.1)
Alkaline phosphatase (APISO): 54 U/L (ref 36–130)
BUN: 11 mg/dL (ref 7–25)
CO2: 29 mmol/L (ref 20–32)
Calcium: 9.8 mg/dL (ref 8.6–10.3)
Chloride: 104 mmol/L (ref 98–110)
Creat: 0.79 mg/dL (ref 0.60–1.24)
Globulin: 2.7 g/dL (calc) (ref 1.9–3.7)
Glucose, Bld: 82 mg/dL (ref 65–99)
Potassium: 4.3 mmol/L (ref 3.5–5.3)
Sodium: 140 mmol/L (ref 135–146)
Total Bilirubin: 1.3 mg/dL — ABNORMAL HIGH (ref 0.2–1.2)
Total Protein: 7.3 g/dL (ref 6.1–8.1)
eGFR: 126 mL/min/{1.73_m2} (ref >=60)

## 2021-10-27 LAB — CBC WITH DIFFERENTIAL/PLATELET
Absolute Monocytes: 327 cells/uL (ref 200–950)
Basophils Absolute: 41 cells/uL (ref 0–200)
Basophils Relative: 0.9 %
Eosinophils Absolute: 110 cells/uL (ref 15–500)
Eosinophils Relative: 2.4 %
HCT: 43.7 % (ref 38.5–50.0)
Hemoglobin: 15.2 g/dL (ref 13.2–17.1)
Lymphs Abs: 1035 cells/uL (ref 850–3900)
MCH: 32.8 pg (ref 27.0–33.0)
MCHC: 34.8 g/dL (ref 32.0–36.0)
MCV: 94.4 fL (ref 80.0–100.0)
MPV: 12.7 fL — ABNORMAL HIGH (ref 7.5–12.5)
Monocytes Relative: 7.1 %
Neutro Abs: 3087 cells/uL (ref 1500–7800)
Neutrophils Relative %: 67.1 %
Platelets: 140 10*3/uL (ref 140–400)
RBC: 4.63 10*6/uL (ref 4.20–5.80)
RDW: 12.3 % (ref 11.0–15.0)
Total Lymphocyte: 22.5 %
WBC: 4.6 10*3/uL (ref 3.8–10.8)

## 2021-10-27 LAB — TSH: TSH: 1.77 mIU/L (ref 0.40–4.50)

## 2021-10-27 LAB — VITAMIN B12: Vitamin B-12: 397 pg/mL (ref 200–1100)

## 2021-10-27 LAB — HIV ANTIBODY (ROUTINE TESTING W REFLEX): HIV 1&2 Ab, 4th Generation: NONREACTIVE

## 2021-10-27 LAB — RPR: RPR Ser Ql: NONREACTIVE

## 2021-10-28 LAB — URINE CYTOLOGY ANCILLARY ONLY
Chlamydia: NEGATIVE
Comment: NEGATIVE
Comment: NORMAL
Neisseria Gonorrhea: NEGATIVE

## 2021-10-28 LAB — CYTOLOGY, (ORAL, ANAL, URETHRAL) ANCILLARY ONLY
Chlamydia: NEGATIVE
Comment: NEGATIVE
Comment: NORMAL
Neisseria Gonorrhea: NEGATIVE

## 2022-01-26 NOTE — Progress Notes (Deleted)
Name: Benjamin Pitts   MRN: [id number]    DOB: 06/02/1995   Date:01/26/2022       Progress Note  Subjective  Chief Complaint  Follow Up  HPI  ADD with learning disability: He is no longer going to [employer], he stopped going because he did not like the school environment . Working part time at [employer] and feels overwhelmed since co-workers don't do their work, when at home always feeling tired and sleeping most of the day.    AR:he is doing well at this time, denies sneezing, rhinorrhea or nasal congestion.   Asthma: He denies cough, sob or wheezing , but he has been found wheezing during exams and was given Advair and he takes it prn    MDD: he did not go see Dr. Elna Breslow, states he had car problems and did not know where it was. He has been feeling better, phq 9 is positive. He states Lexapro and wellbutrin have been helpful, phq 9 today is up again, weight is dropping again and he has been tired and lack of energy also sleeping most of the day   History of STI/high risk : he has a new sexual partner, broke up with previous partner Feb 2023 , new partner since May 2023 . He uses condoms, he is bisexual, he has been taking Truvada and has been compliant. We will recheck labs today   Malnutrition: he was 143 lbs in January  22 and dropped  down to 122 lbs but weight is gradually trending up and but it was 139 lbs last visit and is down to 135 lbs again.   Thrombocytopenia:  sent him to hematologist and platelets back to normal, he was released from their care. We will recheck yearly   Patient Active Problem List   Diagnosis Date Noted   Asthma, well controlled 10/26/2021   Thrombocytopenia (HCC) 12/08/2020   Major depression, recurrent, chronic (HCC) 06/09/2020   Eczema 02/23/2015   Attention-deficit hyperactivity disorder, predominantly hyperactive type 02/23/2015   Allergic rhinitis, seasonal 02/23/2015   Asthma, mild intermittent 02/23/2015   Learning problem 02/23/2015    No past  surgical history on file.  Family History  Problem Relation Age of Onset   Asthma Sister    Seizures Sister     Social History   Tobacco Use   Smoking status: Never   Smokeless tobacco: Never  Substance Use Topics   Alcohol use: No    Alcohol/week: 0.0 standard drinks of alcohol     Current Outpatient Medications:    albuterol (VENTOLIN HFA) 108 (90 Base) MCG/ACT inhaler, Inhale 2 puffs into the lungs every 6 (six) hours as needed for wheezing or shortness of breath., Disp: 18 g, Rfl: 0   buPROPion (WELLBUTRIN XL) 150 MG 24 hr tablet, Take 1 tablet (150 mg total) by mouth at bedtime., Disp: 90 tablet, Rfl: 1   emtricitabine-tenofovir (TRUVADA) 200-300 MG tablet, Take 1 tablet by mouth daily., Disp: 90 tablet, Rfl: 1   escitalopram (LEXAPRO) 10 MG tablet, Take 1 tablet (10 mg total) by mouth daily., Disp: 90 tablet, Rfl: 1   fluticasone-salmeterol (ADVAIR) 100-50 MCG/ACT AEPB, Inhale 1 puff into the lungs 2 (two) times daily., Disp: 1 each, Rfl: 5   lisdexamfetamine (VYVANSE) 50 MG capsule, Take 1 capsule (50 mg total) by mouth every morning., Disp: 30 capsule, Rfl: 0   loratadine (CLARITIN) 10 MG tablet, Take 1 tablet (10 mg total) by mouth daily., Disp: 90 tablet, Rfl: 3  No Known  Allergies  I personally reviewed active problem list, medication list, allergies, family history, social history, health maintenance with the patient/caregiver today.   ROS  ***  Objective  There were no vitals filed for this visit.  There is no height or weight on file to calculate BMI.  Physical Exam ***  No results found for this or any previous visit (from the past 2160 hour(s)).   PHQ2/9:    10/26/2021    1:34 PM 09/20/2021    2:45 PM 06/13/2021    2:23 PM 03/09/2021    2:46 PM 12/20/2020    2:28 PM  Depression screen PHQ 2/9  Decreased Interest 2 1 2 1  0  Down, Depressed, Hopeless 0 2 2 0 0  PHQ - 2 Score 2 3 4 1  0  Altered sleeping 3 1 0 0   Tired, decreased energy 0 1 0 0    Change in appetite 0 2 0 0   Feeling bad or failure about yourself  0 1 0 0   Trouble concentrating 0 0 0 0   Moving slowly or fidgety/restless 0 2 0 0   Suicidal thoughts 0 1 0 0   PHQ-9 Score 5 11 4 1    Difficult doing work/chores  Not difficult at all       phq 9 is {gen pos   Fall Risk:    10/26/2021    1:17 PM 09/20/2021    2:45 PM 06/13/2021    2:22 PM 03/09/2021    2:46 PM 12/20/2020    2:28 PM  Fall Risk   Falls in the past year? 0 0 0 0 0  Number falls in past yr: 0  0 0 0  Injury with Fall? 0  0 0 0  Risk for fall due to : No Fall Risks No Fall Risks No Fall Risks No Fall Risks   Follow up Falls prevention discussed Falls prevention discussed Falls prevention discussed Falls prevention discussed       Functional Status Survey:      Assessment & Plan  *** There are no diagnoses linked to this encounter.

## 2022-01-27 ENCOUNTER — Ambulatory Visit: Payer: Medicaid Other | Admitting: Family Medicine

## 2022-01-27 DIAGNOSIS — Z7253 High risk bisexual behavior: Secondary | ICD-10-CM

## 2022-01-27 DIAGNOSIS — Z113 Encounter for screening for infections with a predominantly sexual mode of transmission: Secondary | ICD-10-CM
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
## 2022-02-07 ENCOUNTER — Other Ambulatory Visit (HOSPITAL_COMMUNITY)
Admission: RE | Admit: 2022-02-07 | Discharge: 2022-02-07 | Disposition: A | Discharge status: Home / Self Care | Payer: Medicaid Other | Source: Ambulatory Visit | Attending: Family Medicine | Admitting: Family Medicine

## 2022-02-07 ENCOUNTER — Encounter: Payer: Self-pay | Admitting: Family Medicine

## 2022-02-07 ENCOUNTER — Ambulatory Visit (INDEPENDENT_AMBULATORY_CARE_PROVIDER_SITE_OTHER): Payer: Medicaid Other | Admitting: Family Medicine

## 2022-02-07 VITALS — BP 118/64 | HR 60 | Resp 16 | Ht 67.0 in | Wt 135.0 lb

## 2022-02-07 DIAGNOSIS — Z23 Encounter for immunization: Secondary | ICD-10-CM | POA: Diagnosis not present

## 2022-02-07 DIAGNOSIS — F901 Attention-deficit hyperactivity disorder, predominantly hyperactive type: Secondary | ICD-10-CM

## 2022-02-07 DIAGNOSIS — F339 Major depressive disorder, recurrent, unspecified: Secondary | ICD-10-CM

## 2022-02-07 DIAGNOSIS — Z7253 High risk bisexual behavior: Secondary | ICD-10-CM | POA: Diagnosis not present

## 2022-02-07 DIAGNOSIS — Z113 Encounter for screening for infections with a predominantly sexual mode of transmission: Secondary | ICD-10-CM | POA: Diagnosis not present

## 2022-02-07 MED ORDER — LISDEXAMFETAMINE DIMESYLATE 50 MG PO CAPS: 50.0000 mg | ORAL_CAPSULE | ORAL | 0 refills | Status: DC

## 2022-02-07 MED ORDER — LISDEXAMFETAMINE DIMESYLATE 50 MG PO CAPS: 50.0000 mg | ORAL_CAPSULE | Freq: Every day | ORAL | 0 refills | Status: DC

## 2022-02-08 LAB — RPR: RPR Ser Ql: NONREACTIVE

## 2022-02-08 LAB — HIV ANTIBODY (ROUTINE TESTING W REFLEX): HIV 1&2 Ab, 4th Generation: NONREACTIVE

## 2022-02-09 LAB — CYTOLOGY, (ORAL, ANAL, URETHRAL) ANCILLARY ONLY
Chlamydia: NEGATIVE
Chlamydia: NEGATIVE
Comment: NEGATIVE
Comment: NEGATIVE
Comment: NORMAL
Comment: NORMAL
Neisseria Gonorrhea: NEGATIVE
Neisseria Gonorrhea: NEGATIVE

## 2022-02-09 LAB — URINE CYTOLOGY ANCILLARY ONLY
Chlamydia: NEGATIVE
Comment: NEGATIVE
Comment: NORMAL
Neisseria Gonorrhea: NEGATIVE

## 2022-03-31 ENCOUNTER — Other Ambulatory Visit
Admission: RE | Admit: 2022-03-31 | Discharge: 2022-03-31 | Disposition: A | Discharge status: Home / Self Care | Payer: Medicaid Other | Attending: Family Medicine | Admitting: Family Medicine

## 2022-03-31 ENCOUNTER — Encounter: Payer: Self-pay | Admitting: Family Medicine

## 2022-03-31 ENCOUNTER — Telehealth: Payer: Self-pay | Admitting: Family Medicine

## 2022-03-31 ENCOUNTER — Other Ambulatory Visit: Payer: Self-pay | Admitting: Family Medicine

## 2022-03-31 ENCOUNTER — Ambulatory Visit: Payer: Medicaid Other | Admitting: Family Medicine

## 2022-03-31 VITALS — BP 116/74 | HR 72 | Temp 98.1°F | Resp 14 | Ht 68.0 in | Wt 127.7 lb

## 2022-03-31 DIAGNOSIS — R1012 Left upper quadrant pain: Secondary | ICD-10-CM | POA: Diagnosis not present

## 2022-03-31 DIAGNOSIS — R197 Diarrhea, unspecified: Secondary | ICD-10-CM

## 2022-03-31 DIAGNOSIS — R630 Anorexia: Secondary | ICD-10-CM | POA: Insufficient documentation

## 2022-03-31 LAB — COMPREHENSIVE METABOLIC PANEL
ALT: 15 U/L (ref 0–44)
AST: 23 U/L (ref 15–41)
Albumin: 4.4 g/dL (ref 3.5–5.0)
Alkaline Phosphatase: 51 U/L (ref 38–126)
Anion gap: 10 (ref 5–15)
BUN: 9 mg/dL (ref 6–20)
CO2: 26 mmol/L (ref 22–32)
Calcium: 9.1 mg/dL (ref 8.9–10.3)
Chloride: 105 mmol/L (ref 98–111)
Creatinine, Ser: 0.84 mg/dL (ref 0.61–1.24)
GFR, Estimated: >60 mL/min (ref >60)
Glucose, Bld: 88 mg/dL (ref 70–99)
Potassium: 4 mmol/L (ref 3.5–5.1)
Sodium: 141 mmol/L (ref 135–145)
Total Bilirubin: 2 mg/dL — ABNORMAL HIGH (ref 0.3–1.2)
Total Protein: 7.8 g/dL (ref 6.5–8.1)

## 2022-03-31 LAB — CBC WITH DIFFERENTIAL/PLATELET
Abs Immature Granulocytes: 0.01 10*3/uL (ref 0.00–0.07)
Basophils Absolute: 0 10*3/uL (ref 0.0–0.1)
Basophils Relative: 1 %
Eosinophils Absolute: 0 10*3/uL (ref 0.0–0.5)
Eosinophils Relative: 1 %
HCT: 43.5 % (ref 39.0–52.0)
Hemoglobin: 14.8 g/dL (ref 13.0–17.0)
Immature Granulocytes: 0 %
Lymphocytes Relative: 22 %
Lymphs Abs: 1.1 10*3/uL (ref 0.7–4.0)
MCH: 31.4 pg (ref 26.0–34.0)
MCHC: 34 g/dL (ref 30.0–36.0)
MCV: 92.2 fL (ref 80.0–100.0)
Monocytes Absolute: 0.6 10*3/uL (ref 0.1–1.0)
Monocytes Relative: 13 %
Neutro Abs: 3 10*3/uL (ref 1.7–7.7)
Neutrophils Relative %: 63 %
Platelets: 210 10*3/uL (ref 150–400)
RBC: 4.72 MIL/uL (ref 4.22–5.81)
RDW: 11.8 % (ref 11.5–15.5)
WBC: 4.8 10*3/uL (ref 4.0–10.5)
nRBC: 0 % (ref 0.0–0.2)

## 2022-03-31 LAB — LIPASE, BLOOD: Lipase: 30 U/L (ref 11–51)

## 2022-03-31 MED ORDER — OMEPRAZOLE 20 MG PO CPDR: 20.0000 mg | DELAYED_RELEASE_CAPSULE | Freq: Two times a day (BID) | ORAL | 0 refills | Status: DC

## 2022-03-31 NOTE — Telephone Encounter (Unsigned)
Copied from CRM [id number]. Topic: General - Inquiry >> Mar 31, 2022  2:44 PM De Blanch wrote: Reason for CRM: Pt is requesting a work note to be excused from 03/30/2022 today, 03/31/2022, and would like a PCP opinion on what he should do for tomorrow, whether he should go in and or wait for lab results.   Pt will try to have a fax number ready for his employer, for when his call is returned, provided information to download MyChart.   Please advise.

## 2022-03-31 NOTE — Telephone Encounter (Signed)
Letter written and patient aware. He will pick up letter Monday due to manager not having a fax number.
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
## 2022-06-13 ENCOUNTER — Ambulatory Visit: Payer: Medicaid Other | Admitting: Family Medicine

## 2022-06-26 NOTE — Progress Notes (Unsigned)
Name: Benjamin Pitts   MRN: [id number]    DOB: [birth year]   Date:06/27/2022       Progress Note  Subjective  Chief Complaint  Follow Up  HPI  ADD with learning disability: He is no longer going to [facility], he stopped going because he did not like the school environment . Working at [employer] in [place]. Takes Vyvanse on work days and is tolerating it well. However based on controlled substance database he fill the last rx of Vyvanse May 2023 , explained to him he has 3 active rx at his pharmacy   AR:he is doing well at this time, denies sneezing, rhinorrhea or nasal congestion. He states symptoms usually worse with weather change   Asthma: He denies cough, sob or wheezing , but he has been found wheezing during exams and was given Advair, he states only uses it prn   MDD: he did not go see Dr. Shea Evans, states he had car problems and did not know where it was. He has been feeling better, phq 9 is positive. He has not been taking lexapro and wellbutrin , he states he forgets to take it ,explain to him medication only works when he takes it daily   History of STI/high risk : he has a new sexual partner, broke up with previous partner Feb 2023 , new partner since May 2023 . He uses condoms, he is bisexual, he has been taking Truvada and has been compliant. He is due for repeat labs   Thrombocytopenia:  sent him to hematologist and platelets back to normal, he was released from their care.   Patient Active Problem List   Diagnosis Date Noted   Asthma, well controlled 10/26/2021   Thrombocytopenia ([place]) 12/08/2020   Major depression, recurrent, chronic ([place]) 06/09/2020   Eczema 02/23/2015   Attention-deficit hyperactivity disorder, predominantly hyperactive type 02/23/2015   Allergic rhinitis, seasonal 02/23/2015   Asthma, mild intermittent 02/23/2015   Learning problem 02/23/2015    No past surgical history on file.  Family History  Problem Relation Age of Onset   Asthma Sister     Seizures Sister     Social History   Tobacco Use   Smoking status: Never   Smokeless tobacco: Never  Substance Use Topics   Alcohol use: No    Alcohol/week: 0.0 standard drinks of alcohol     Current Outpatient Medications:    albuterol (VENTOLIN HFA) 108 (90 Base) MCG/ACT inhaler, Inhale 2 puffs into the lungs every 6 (six) hours as needed for wheezing or shortness of breath., Disp: 18 g, Rfl: 0   buPROPion (WELLBUTRIN XL) 150 MG 24 hr tablet, Take 1 tablet (150 mg total) by mouth at bedtime., Disp: 90 tablet, Rfl: 1   emtricitabine-tenofovir (TRUVADA) 200-300 MG tablet, Take 1 tablet by mouth daily., Disp: 90 tablet, Rfl: 1   escitalopram (LEXAPRO) 10 MG tablet, Take 1 tablet (10 mg total) by mouth daily., Disp: 90 tablet, Rfl: 1   fluticasone-salmeterol (ADVAIR) 100-50 MCG/ACT AEPB, Inhale 1 puff into the lungs 2 (two) times daily., Disp: 1 each, Rfl: 5   lisdexamfetamine (VYVANSE) 50 MG capsule, Take 1 capsule (50 mg total) by mouth every morning., Disp: 30 capsule, Rfl: 0   lisdexamfetamine (VYVANSE) 50 MG capsule, Take 1 capsule (50 mg total) by mouth daily., Disp: 30 capsule, Rfl: 0   loratadine (CLARITIN) 10 MG tablet, Take 1 tablet (10 mg total) by mouth daily., Disp: 90 tablet, Rfl: 3   omeprazole (PRILOSEC) 20 MG  capsule, Take 1 capsule (20 mg total) by mouth 2 (two) times daily before a meal., Disp: 28 capsule, Rfl: 0  No Known Allergies  I personally reviewed active problem list, medication list, allergies, family history, social history, health maintenance with the patient/caregiver today.   ROS  Constitutional: Negative for fever or weight change.  Respiratory: Negative for cough and shortness of breath.   Cardiovascular: Negative for chest pain or palpitations.  Gastrointestinal: Negative for abdominal pain, no bowel changes.  Musculoskeletal: Negative for gait problem or joint swelling.  Skin: Negative for rash.  Neurological: Negative for dizziness or headache.   No other specific complaints in a complete review of systems (except as listed in HPI above).   Objective  Vitals:   06/27/22 1353  BP: 118/68  Pulse: 77  Resp: 16  SpO2: 99%  Weight: 130 lb (59 kg)  Height: 5' 8"$  (1.727 m)    Body mass index is 19.77 kg/m.  Physical Exam  Constitutional: Patient appears well-developed and well-nourished.  No distress.  HEENT: head atraumatic, normocephalic, pupils equal and reactive to light, neck supple Cardiovascular: Normal rate, regular rhythm and normal heart sounds.  No murmur heard. No BLE edema. Pulmonary/Chest: Effort normal and breath sounds normal. No respiratory distress. Abdominal: Soft.  There is no tenderness. Psychiatric: Patient has a normal mood and affect. behavior is normal. Judgment and thought content normal.   Recent Results (from the past 2160 hour(s))  Lipase, blood     Status: None   Collection Time: 03/31/22  2:30 PM  Result Value Ref Range   Lipase 30 11 - 51 U/L    Comment: Performed at [facility], [address] [place], [place] [address]  Comprehensive metabolic panel     Status: Abnormal   Collection Time: 03/31/22  2:30 PM  Result Value Ref Range   Sodium 141 135 - 145 mmol/L   Potassium 4.0 3.5 - 5.1 mmol/L   Chloride 105 98 - 111 mmol/L   CO2 26 22 - 32 mmol/L   Glucose, Bld 88 70 - 99 mg/dL    Comment: Glucose reference range applies only to samples taken after fasting for at least 8 hours.   BUN 9 6 - 20 mg/dL   Creatinine, Ser 0.84 0.61 - 1.24 mg/dL   Calcium 9.1 8.9 - 10.3 mg/dL   Total Protein 7.8 6.5 - 8.1 g/dL   Albumin 4.4 3.5 - 5.0 g/dL   AST 23 15 - 41 U/L   ALT 15 0 - 44 U/L   Alkaline Phosphatase 51 38 - 126 U/L   Total Bilirubin 2.0 (H) 0.3 - 1.2 mg/dL   GFR, Estimated >60 >60 mL/min    Comment: (NOTE) Calculated using the CKD-EPI Creatinine Equation (2021)    Anion gap 10 5 - 15    Comment: Performed at [facility], [address] [place], [place]  [address]  CBC with Differential/Platelet     Status: None   Collection Time: 03/31/22  2:30 PM  Result Value Ref Range   WBC 4.8 4.0 - 10.5 K/uL   RBC 4.72 4.22 - 5.81 MIL/uL   Hemoglobin 14.8 13.0 - 17.0 g/dL   HCT 43.5 39.0 - 52.0 %   MCV 92.2 80.0 - 100.0 fL   MCH 31.4 26.0 - 34.0 pg   MCHC 34.0 30.0 - 36.0 g/dL   RDW 11.8 11.5 - 15.5 %   Platelets 210 150 - 400 K/uL   nRBC 0.0 0.0 -  0.2 %   Neutrophils Relative % 63 %   Neutro Abs 3.0 1.7 - 7.7 K/uL   Lymphocytes Relative 22 %   Lymphs Abs 1.1 0.7 - 4.0 K/uL   Monocytes Relative 13 %   Monocytes Absolute 0.6 0.1 - 1.0 K/uL   Eosinophils Relative 1 %   Eosinophils Absolute 0.0 0.0 - 0.5 K/uL   Basophils Relative 1 %   Basophils Absolute 0.0 0.0 - 0.1 K/uL   Immature Granulocytes 0 %   Abs Immature Granulocytes 0.01 0.00 - 0.07 K/uL    Comment: Performed at [facility], [address] [place], [place] [address]    PHQ2/9:    06/27/2022    1:53 PM 03/31/2022   11:45 AM 02/07/2022    2:03 PM 10/26/2021    1:34 PM 09/20/2021    2:45 PM  Depression screen PHQ 2/9  Decreased Interest 1 0 1 2 1  $ Down, Depressed, Hopeless 0 0 0 0 2  PHQ - 2 Score 1 0 1 2 3  $ Altered sleeping 0 0 0 3 1  Tired, decreased energy 2 0 0 0 1  Change in appetite 3 0 0 0 2  Feeling bad or failure about yourself  0 0 0 0 1  Trouble concentrating 0 0 0 0 0  Moving slowly or fidgety/restless 0 0 0 0 2  Suicidal thoughts 0 0 0 0 1  PHQ-9 Score 6 0 1 5 11  $ Difficult doing work/chores     Not difficult at all    phq 9 is positive   Fall Risk:    06/27/2022    1:52 PM 03/31/2022   11:45 AM 02/07/2022    2:03 PM 10/26/2021    1:17 PM 09/20/2021    2:45 PM  Fall Risk   Falls in the past year? 0 0 0 0 0  Number falls in past yr: 0  0 0   Injury with Fall? 0  0 0   Risk for fall due to : No Fall Risks No Fall Risks No Fall Risks No Fall Risks No Fall Risks  Follow up Falls prevention discussed Falls prevention discussed;Education  provided;Falls evaluation completed Falls prevention discussed Falls prevention discussed Falls prevention discussed      Functional Status Survey: Is the patient deaf or have difficulty hearing?: No Does the patient have difficulty seeing, even when wearing glasses/contacts?: No Does the patient have difficulty concentrating, remembering, or making decisions?: No Does the patient have difficulty walking or climbing stairs?: No Does the patient have difficulty dressing or bathing?: No Does the patient have difficulty doing errands alone such as visiting a doctor's office or shopping?: No    Assessment & Plan  1. Routine screening for STI (sexually transmitted infection)  - Urine cytology ancillary only - HIV antibody (with reflex) - RPR - emtricitabine-tenofovir (TRUVADA) 200-300 MG tablet; Take 1 tablet by mouth daily.  Dispense: 90 tablet; Refill: 1  2. High risk bisexual behavior  - Urine cytology ancillary only - HIV antibody (with reflex) - RPR - emtricitabine-tenofovir (TRUVADA) 200-300 MG tablet; Take 1 tablet by mouth daily.  Dispense: 90 tablet; Refill: 1  3. Major depression, recurrent, chronic (HCC)  Resume medication  4. Attention-deficit hyperactivity disorder, predominantly hyperactive type  Resume vyvanse prn    5. Thrombocytopenia ([place])  Seen by hematologist in the past   6. Mild intermittent asthma with acute exacerbation

## 2022-06-27 ENCOUNTER — Encounter: Payer: Self-pay | Admitting: Family Medicine

## 2022-06-27 ENCOUNTER — Other Ambulatory Visit (HOSPITAL_COMMUNITY)
Admission: RE | Admit: 2022-06-27 | Discharge: 2022-06-27 | Disposition: A | Discharge status: Home / Self Care | Payer: Medicaid Other | Source: Ambulatory Visit | Attending: Family Medicine | Admitting: Family Medicine

## 2022-06-27 ENCOUNTER — Ambulatory Visit (INDEPENDENT_AMBULATORY_CARE_PROVIDER_SITE_OTHER): Payer: Medicaid Other | Admitting: Family Medicine

## 2022-06-27 VITALS — BP 118/68 | HR 77 | Resp 16 | Ht 68.0 in | Wt 130.0 lb

## 2022-06-27 DIAGNOSIS — Z113 Encounter for screening for infections with a predominantly sexual mode of transmission: Secondary | ICD-10-CM | POA: Diagnosis not present

## 2022-06-27 DIAGNOSIS — J4521 Mild intermittent asthma with (acute) exacerbation: Secondary | ICD-10-CM | POA: Diagnosis not present

## 2022-06-27 DIAGNOSIS — F901 Attention-deficit hyperactivity disorder, predominantly hyperactive type: Secondary | ICD-10-CM | POA: Diagnosis not present

## 2022-06-27 DIAGNOSIS — Z7253 High risk bisexual behavior: Secondary | ICD-10-CM | POA: Diagnosis not present

## 2022-06-27 DIAGNOSIS — F339 Major depressive disorder, recurrent, unspecified: Secondary | ICD-10-CM

## 2022-06-27 DIAGNOSIS — D696 Thrombocytopenia, unspecified: Secondary | ICD-10-CM | POA: Diagnosis not present

## 2022-06-27 MED ORDER — EMTRICITABINE-TENOFOVIR DF 200-300 MG PO TABS: 1.0000 | ORAL_TABLET | Freq: Every day | ORAL | 1 refills | Status: DC

## 2022-06-28 LAB — RPR: RPR Ser Ql: NONREACTIVE

## 2022-06-28 LAB — HIV ANTIBODY (ROUTINE TESTING W REFLEX): HIV 1&2 Ab, 4th Generation: NONREACTIVE

## 2022-06-29 LAB — URINE CYTOLOGY ANCILLARY ONLY
Chlamydia: NEGATIVE
Comment: NEGATIVE
Comment: NEGATIVE
Comment: NORMAL
Neisseria Gonorrhea: NEGATIVE
Trichomonas: NEGATIVE

## 2022-10-18 ENCOUNTER — Ambulatory Visit: Payer: Medicaid Other | Admitting: Family Medicine

## 2022-10-18 ENCOUNTER — Encounter: Payer: Self-pay | Admitting: Family Medicine

## 2022-10-18 VITALS — BP 110/72 | HR 83 | Temp 98.1°F | Resp 16 | Ht 68.0 in | Wt 131.9 lb

## 2022-10-18 DIAGNOSIS — L0291 Cutaneous abscess, unspecified: Secondary | ICD-10-CM

## 2022-10-18 MED ORDER — DOXYCYCLINE HYCLATE 100 MG PO TABS: 100.0000 mg | ORAL_TABLET | Freq: Two times a day (BID) | ORAL | 0 refills | Status: AC

## 2022-10-18 NOTE — Patient Instructions (Addendum)
Dial soap or hibiclense soap with warm soapy water and do soaks over the area for 10 min 2-3 times a day to keep the abscess open and draining and helaing from the inside out.  The antibiotics will help the surrounding tissues with infection.  You should see an improvement in the size and swelling of the area in 24-48 hours. You should see a slow improvement in the opening and size gradually over 1-2 weeks if it is healing properly  If there is still remaining pockets of infection it will usually stall healing or get worse and we would need to see you back in office or go to an Urgent Care for recheck.  You may need an incision and drainage procedure.    Skin Abscess  A skin abscess is an infected area on or under your skin. It contains pus and other material. An abscess may also be called a furuncle, carbuncle, or boil. It is often the result of an infection caused by bacteria. An abscess can occur in or on almost any part of your body. Sometimes, an abscess may break open (rupture) on its own. In most cases, it will keep getting worse unless it is treated. An abscess can cause pain and make you feel ill. An untreated abscess can cause infection to spread to other parts of your body or your bloodstream. The abscess may need to be drained. You may also need to take antibiotics. What are the causes? An abscess occurs when germs, like bacteria, pass through your skin and cause an infection. This may be caused by: A scrape or cut on your skin. A puncture wound through your skin, such as a needle injection or insect bite. Blocked oil or sweat glands. Blocked and infected hair follicles. A fluid-filled sac that forms beneath your skin (sebaceous cyst) and becomes infected. What increases the risk? You may be more likely to develop an abscess if: You have problems with blood circulation, or you have a weak body defense system (immune system). You have diabetes. You have dry and irritated  skin. You get injections often or use IV drugs. You have a foreign body in a wound, such as a splinter. You smoke or use tobacco products. What are the signs or symptoms? Symptoms of this condition include: A painful, firm bump under the skin. A bump with pus at the top. This may break through the skin and drain. Other symptoms include: Redness and swelling around the abscess. Warmth or tenderness. Swelling of the lymph nodes (glands) near the abscess. A sore on the skin. How is this diagnosed? This condition may be diagnosed based on a physical exam and your medical history. You may also have tests done, such as: A test of a sample of pus. This may be done to find what is causing the infection. Blood tests. Imaging tests, such as an ultrasound, CT scan, or MRI. How is this treated? A small abscess that drains on its own may not need to be treated. Treatment for larger abscesses may include: Moist heat or a heat pack applied to the area a few times a day. Incision and drainage. This is a procedure to drain the abscess. Antibiotics. For a severe abscess, you may first get antibiotics through an IV and then change to antibiotics by mouth. Follow these instructions at home: Medicines Take over-the-counter and prescription medicines only as told by your provider. If you were prescribed antibiotics, take them as told by your provider. Do not stop using  the antibiotic even if you start to feel better. Abscess care  If you have an abscess that has not drained, apply heat to the affected area. Use the heat source that your provider recommends, such as a moist heat pack or a heating pad. Place a towel between your skin and the heat source. Leave the heat on for 20-30 minutes at a time. If your skin turns bright red, remove the heat right away to prevent burns. The risk of burns is higher if you cannot feel pain, heat, or cold. Follow instructions from your provider about how to take care of  your abscess. Make sure you: Cover the abscess with a bandage (dressing). Wash your hands with soap and water for at least 20 seconds before and after you change the dressing or gauze. If soap and water are not available, use hand sanitizer. Change your dressing or gauze as told by your provider. Check your abscess every day for signs of an infection that is getting worse. Check for: More redness, swelling, pain, or tenderness. More fluid or blood. Warmth. More pus or a worse smell. General instructions To avoid spreading the infection: Do not share personal care items, towels, or hot tubs with others. Avoid making skin contact with other people. Be careful when getting rid of used dressings, wound packing, or any drainage from the abscess. Do not use any products that contain nicotine or tobacco. These products include cigarettes, chewing tobacco, and vaping devices, such as e-cigarettes. If you need help quitting, ask your provider. Do not use any creams, ointments, or liquids unless you have been told to by your provider. Contact a health care provider if: You see redness that spreads quickly or red streaks on your skin spreading away from the abscess. You have any signs of worse infection at the abscess. You vomit every time you eat or drink. You have a fever, chills, or muscle aches. The cyst or abscess returns. Get help right away if: You have severe pain. You make less pee (urine) than normal. This information is not intended to replace advice given to you by your health care provider. Make sure you discuss any questions you have with your health care provider. Document Revised: 12/14/2021 Document Reviewed: 12/14/2021 Elsevier Patient Education  2024 [employer].

## 2022-10-18 NOTE — Progress Notes (Signed)
Patient ID: Benjamin Pitts, male    DOB: 1995/08/08, 27 y.o.   MRN: [id number]  PCP: Alba Cory, MD  Chief Complaint  Patient presents with   Insect Bite    Possible spider bite, for a while but noticed it 3 days ago. Pt feels it is affecting his walking ans stings a little    Subjective:   Benjamin Pitts is a 27 y.o. male, presents to clinic with CC of the following:  HPI   Left hip/side infection/ appears like an abscess - he noticed about 3 d ago I it was red, swollen and painful which he describes as burning.  Last night it was swelling more and started to come to a head and a family member helped push on it to get it to drain.  He has purulent and bloody drainage and currently has a open area in the center which is continuing to drain.  He did hit the area last night at work and it was severely painful and felt like its shooting pain down his leg and making it hard to walk.   At home he has only put some hydrogen peroxide on it and around it and no other measures taken, no meds topically applied No hx of ascesses He is not sure what started it - guesses a bug bite No constitutional sx, denies fever, sweats, body ache   Patient Active Problem List   Diagnosis Date Noted   Asthma, well controlled 10/26/2021   Thrombocytopenia (HCC) 12/08/2020   Major depression, recurrent, chronic (HCC) 06/09/2020   Eczema 02/23/2015   Attention-deficit hyperactivity disorder, predominantly hyperactive type 02/23/2015   Allergic rhinitis, seasonal 02/23/2015   Asthma, mild intermittent 02/23/2015   Learning problem 02/23/2015      Current Outpatient Medications:    albuterol (VENTOLIN HFA) 108 (90 Base) MCG/ACT inhaler, Inhale 2 puffs into the lungs every 6 (six) hours as needed for wheezing or shortness of breath., Disp: 18 g, Rfl: 0   buPROPion (WELLBUTRIN XL) 150 MG 24 hr tablet, Take 1 tablet (150 mg total) by mouth at bedtime., Disp: 90 tablet, Rfl: 1    emtricitabine-tenofovir (TRUVADA) 200-300 MG tablet, Take 1 tablet by mouth daily., Disp: 90 tablet, Rfl: 1   escitalopram (LEXAPRO) 10 MG tablet, Take 1 tablet (10 mg total) by mouth daily., Disp: 90 tablet, Rfl: 1   fluticasone-salmeterol (ADVAIR) 100-50 MCG/ACT AEPB, Inhale 1 puff into the lungs 2 (two) times daily., Disp: 1 each, Rfl: 5   lisdexamfetamine (VYVANSE) 50 MG capsule, Take 1 capsule (50 mg total) by mouth every morning., Disp: 30 capsule, Rfl: 0   lisdexamfetamine (VYVANSE) 50 MG capsule, Take 1 capsule (50 mg total) by mouth daily., Disp: 30 capsule, Rfl: 0   loratadine (CLARITIN) 10 MG tablet, Take 1 tablet (10 mg total) by mouth daily., Disp: 90 tablet, Rfl: 3   omeprazole (PRILOSEC) 20 MG capsule, Take 1 capsule (20 mg total) by mouth 2 (two) times daily before a meal., Disp: 28 capsule, Rfl: 0   No Known Allergies   Social History   Tobacco Use   Smoking status: Never   Smokeless tobacco: Never  Vaping Use   Vaping Use: Never used  Substance Use Topics   Alcohol use: No    Alcohol/week: 0.0 standard drinks of alcohol   Drug use: No      Chart Review Today: I personally reviewed active problem list, medication list, allergies, family history, social history, health maintenance, notes from  last encounter, lab results, imaging with the patient/caregiver today.   Review of Systems  Constitutional: Negative.   HENT: Negative.    Eyes: Negative.   Respiratory: Negative.    Cardiovascular: Negative.   Gastrointestinal: Negative.   Endocrine: Negative.   Genitourinary: Negative.   Musculoskeletal: Negative.   Skin: Negative.   Allergic/Immunologic: Negative.   Neurological: Negative.   Hematological: Negative.   Psychiatric/Behavioral: Negative.    All other systems reviewed and are negative.      Objective:   Vitals:   10/18/22 1140  BP: 110/72  Pulse: 83  Resp: 16  Temp: 98.1 F (36.7 C)  TempSrc: Oral  SpO2: 96%  Weight: 131 lb 14.4 oz (59.8  kg)  Height: 5\' 8"  (1.727 m)    Body mass index is 20.06 kg/m.  Physical Exam Vitals and nursing note reviewed.  Constitutional:      Appearance: He is well-developed.  HENT:     Head: Normocephalic and atraumatic.     Nose: Nose normal.  Eyes:     General:        Right eye: No discharge.        Left eye: No discharge.     Conjunctiva/sclera: Conjunctivae normal.  Neck:     Trachea: No tracheal deviation.  Cardiovascular:     Rate and Rhythm: Normal rate and regular rhythm.  Pulmonary:     Effort: Pulmonary effort is normal. No respiratory distress.     Breath sounds: No stridor.  Musculoskeletal:        General: Normal range of motion.  Skin:    General: Skin is warm and dry.     Findings: Abscess present. No rash.     Comments: Left outer hip area 6x6 area of induration and erythema with central open ~1.5 cm diameter wound with purulent and bloody discharge, very tender with palpation to the area No fluctuance  Neurological:     Mental Status: He is alert.     Motor: No abnormal muscle tone.     Coordination: Coordination normal.  Psychiatric:        Behavior: Behavior normal.      Results for orders placed or performed in visit on 06/27/22  HIV antibody (with reflex)  Result Value Ref Range   HIV 1&2 Ab, 4th Generation NON-REACTIVE NON-REACTIVE  RPR  Result Value Ref Range   RPR Ser Ql NON-REACTIVE NON-REACTIVE  Urine cytology ancillary only  Result Value Ref Range   Neisseria Gonorrhea Negative    Chlamydia Negative    Trichomonas Negative    Comment Normal Reference Range Trichomonas - Negative    Comment Normal Reference [place] Chlamydia - Negative    Comment      Normal Reference Range Neisseria Gonorrhea - Negative       Assessment & Plan:   1. Abscess Pt encouraged to stop using hydrogen peroxide to the area Warm clean soaks (instructions given to pt, over phone to 2 family members and written into AVS and reviewed with him at length) explained  that warm soaks help encourage continued drainage of the abscess and keep the opening soft and open Antibiotics to treat the surrounding cellulitis Expect to see improvement in cellulitis in 48 hours and then slow improvement over 1-2 weeks, f/up if any worsening  At the time of exam there was no fluctuance and since the center of the abscess was fairly large and draining I did not see any indication to do an I&D - if  he has any worsening I&D will likely be necessary  Abx to cover for possible mrsa - though no hx Pt has no constitutional sx, no hx of mrsa or immunocompromise - doxycycline (VIBRA-TABS) 100 MG tablet; Take 1 tablet (100 mg total) by mouth 2 (two) times daily for 7 days.  Dispense: 14 tablet; Refill: 0  F/up Friday if it is not starting to improve, f/up in 3-7 d as needed for abscess recheck    Danelle Berry, PA-C 10/18/22 11:50 AM

## 2022-10-20 ENCOUNTER — Telehealth: Payer: Self-pay | Admitting: Family Medicine

## 2022-10-20 NOTE — Telephone Encounter (Signed)
Copied from CRM [id number]. Topic: General - Other >> Oct 20, 2022  2:39 PM Dominique A wrote: Reason for CRM: Pt is calling in to see if someone from the office can call his job to let them know that he not to return to work until 10/21/2022. Pt did get a paper copy note for his job but has not been able to turn it in. I did offer to send the link to pt to set up his mychart and did advise him that a copy of his letter is in his mychart where he can send it to his work. Pt is still wanting to see if someone could call his job:  [employer] [phone]  Pt would like a call back as well.

## 2022-10-20 NOTE — Telephone Encounter (Signed)
Pt called in wanting Cassandra but let him know that Elonda Husky was already gone for the day. I read the message and talked with Heln and she said that we do not call peoples work for he has the work note. I sent him a link to set up the My Chart thru email and text so he may could set it up and get the note that way maybe.

## 2022-10-20 NOTE — Telephone Encounter (Signed)
Not aware that we can call patient job, he will need to get letter through my chart or come pickup Monday.  Pt notified
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
## 2022-10-25 ENCOUNTER — Ambulatory Visit (INDEPENDENT_AMBULATORY_CARE_PROVIDER_SITE_OTHER): Payer: Medicaid Other | Admitting: Family Medicine

## 2022-10-25 ENCOUNTER — Encounter: Payer: Self-pay | Admitting: Family Medicine

## 2022-10-25 ENCOUNTER — Other Ambulatory Visit (HOSPITAL_COMMUNITY)
Admission: RE | Admit: 2022-10-25 | Discharge: 2022-10-25 | Disposition: A | Discharge status: Home / Self Care | Payer: Medicaid Other | Source: Ambulatory Visit | Attending: Family Medicine | Admitting: Family Medicine

## 2022-10-25 VITALS — BP 102/66 | HR 71 | Resp 16 | Ht 68.0 in | Wt 131.0 lb

## 2022-10-25 DIAGNOSIS — E538 Deficiency of other specified B group vitamins: Secondary | ICD-10-CM

## 2022-10-25 DIAGNOSIS — F901 Attention-deficit hyperactivity disorder, predominantly hyperactive type: Secondary | ICD-10-CM | POA: Diagnosis not present

## 2022-10-25 DIAGNOSIS — F339 Major depressive disorder, recurrent, unspecified: Secondary | ICD-10-CM | POA: Diagnosis not present

## 2022-10-25 DIAGNOSIS — Z113 Encounter for screening for infections with a predominantly sexual mode of transmission: Secondary | ICD-10-CM | POA: Diagnosis not present

## 2022-10-25 DIAGNOSIS — D696 Thrombocytopenia, unspecified: Secondary | ICD-10-CM | POA: Diagnosis not present

## 2022-10-25 DIAGNOSIS — Z7253 High risk bisexual behavior: Secondary | ICD-10-CM | POA: Diagnosis not present

## 2022-10-25 MED ORDER — ESCITALOPRAM OXALATE 10 MG PO TABS: 10.0000 mg | ORAL_TABLET | Freq: Every day | ORAL | 1 refills | Status: DC

## 2022-10-25 MED ORDER — EMTRICITABINE-TENOFOVIR DF 200-300 MG PO TABS: 1.0000 | ORAL_TABLET | Freq: Every day | ORAL | 1 refills | Status: DC

## 2022-10-25 MED ORDER — LISDEXAMFETAMINE DIMESYLATE 50 MG PO CAPS: 50.0000 mg | ORAL_CAPSULE | Freq: Every day | ORAL | 0 refills | Status: DC

## 2022-10-26 LAB — RPR: RPR Ser Ql: NONREACTIVE

## 2022-10-26 LAB — B12 AND FOLATE PANEL
Folate: 8.2 ng/mL
Vitamin B-12: 496 pg/mL (ref 200–1100)

## 2022-10-26 LAB — HIV ANTIBODY (ROUTINE TESTING W REFLEX): HIV 1&2 Ab, 4th Generation: NONREACTIVE

## 2022-10-27 LAB — URINE CYTOLOGY ANCILLARY ONLY
Chlamydia: NEGATIVE
Comment: NEGATIVE
Comment: NEGATIVE
Comment: NORMAL
Neisseria Gonorrhea: NEGATIVE
Trichomonas: NEGATIVE

## 2022-10-30 LAB — MOLECULAR ANCILLARY ONLY
Chlamydia: NEGATIVE
Comment: NEGATIVE
Comment: NEGATIVE
Comment: NORMAL
Neisseria Gonorrhea: NEGATIVE
Trichomonas: NEGATIVE

## 2022-11-01 ENCOUNTER — Telehealth: Payer: Self-pay

## 2022-11-01 NOTE — Telephone Encounter (Signed)
Pt given lab results per notes of Dr. Carlynn Purl on 11/01/22. Pt verbalized understanding.

## 2022-11-15 DIAGNOSIS — Y92410 Unspecified street and highway as the place of occurrence of the external cause: Secondary | ICD-10-CM | POA: Insufficient documentation

## 2022-11-15 DIAGNOSIS — J45909 Unspecified asthma, uncomplicated: Secondary | ICD-10-CM | POA: Diagnosis not present

## 2022-11-15 DIAGNOSIS — S29012A Strain of muscle and tendon of back wall of thorax, initial encounter: Secondary | ICD-10-CM | POA: Diagnosis not present

## 2022-11-15 DIAGNOSIS — Z7951 Long term (current) use of inhaled steroids: Secondary | ICD-10-CM | POA: Diagnosis not present

## 2022-11-15 DIAGNOSIS — S29002A Unspecified injury of muscle and tendon of back wall of thorax, initial encounter: Secondary | ICD-10-CM | POA: Diagnosis present

## 2022-11-16 ENCOUNTER — Emergency Department: Payer: Medicaid Other

## 2022-11-16 ENCOUNTER — Other Ambulatory Visit: Payer: Self-pay

## 2022-11-16 ENCOUNTER — Emergency Department
Admission: EM | Admit: 2022-11-16 | Discharge: 2022-11-16 | Disposition: A | Discharge status: Home / Self Care | Payer: Medicaid Other | Attending: Emergency Medicine | Admitting: Emergency Medicine

## 2022-11-16 DIAGNOSIS — S29019A Strain of muscle and tendon of unspecified wall of thorax, initial encounter: Secondary | ICD-10-CM

## 2022-11-16 MED ORDER — NAPROXEN 500 MG PO TABS: 500.0000 mg | ORAL_TABLET | Freq: Two times a day (BID) | ORAL | 0 refills | Status: DC

## 2022-11-16 MED ORDER — CYCLOBENZAPRINE HCL 5 MG PO TABS: ORAL_TABLET | ORAL | 0 refills | Status: DC

## 2022-11-16 MED ORDER — NAPROXEN 500 MG PO TABS
500.0000 mg | ORAL_TABLET | Freq: Once | ORAL | Status: AC
  Administered 2022-11-16: 500 mg via ORAL
  Filled 2022-11-16: qty 1

## 2022-11-16 MED ORDER — CYCLOBENZAPRINE HCL 10 MG PO TABS
5.0000 mg | ORAL_TABLET | Freq: Once | ORAL | Status: AC
  Administered 2022-11-16: 5 mg via ORAL
  Filled 2022-11-16: qty 1
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
## 2022-11-16 NOTE — Discharge Instructions (Signed)
You may take medicines as needed for pain and muscle spasms (Naprosyn/Flexeril).  Apply moist heat to affected area several times daily. Return to the ER for worsening symptoms, persistent vomiting, difficulty breathing, extremity weakness/numbness/tingling or other concerns.

## 2023-02-26 NOTE — Progress Notes (Unsigned)
Name: Benjamin Pitts   MRN: [id number]    DOB: 1995/08/16   Date:02/27/2023       Progress Note  Subjective  Chief Complaint  Annual Exam  HPI  Patient presents for annual CPE.  IPSS Questionnaire (AUA-7): Over the past month.   1)  How often have you had a sensation of not emptying your bladder completely after you finish urinating?  0 - Not at all  2)  How often have you had to urinate again less than two hours after you finished urinating? 0 - Not at all  3)  How often have you found you stopped and started again several times when you urinated?  0 - Not at all  4) How difficult have you found it to postpone urination?  0 - Not at all  5) How often have you had a weak urinary stream?  0 - Not at all  6) How often have you had to push or strain to begin urination?  0 - Not at all  7) How many times did you most typically get up to urinate from the time you went to bed until the time you got up in the morning?  0 - None  Total score:  0-7 mildly symptomatic   8-19 moderately symptomatic   20-35 severely symptomatic     Diet: eating mostly at home  Exercise: discussed 150 minutes per week  Last Dental Exam: Reminded to schedule Last Eye Exam: Reminded to schedule  Depression: phq 9 is negative    02/27/2023    2:43 PM 10/18/2022   11:40 AM 06/27/2022    1:53 PM 03/31/2022   11:45 AM 02/07/2022    2:03 PM  Depression screen PHQ 2/9  Decreased Interest 0 0 1 0 1  Down, Depressed, Hopeless 0 0 0 0 0  PHQ - 2 Score 0 0 1 0 1  Altered sleeping 1 0 0 0 0  Tired, decreased energy 2 0 2 0 0  Change in appetite 2 0 3 0 0  Feeling bad or failure about yourself  0 0 0 0 0  Trouble concentrating 1 0 0 0 0  Moving slowly or fidgety/restless 2 0 0 0 0  Suicidal thoughts 0 0 0 0 0  PHQ-9 Score 8 0 6 0 1  Difficult doing work/chores Somewhat difficult Not difficult at all       Hypertension:  BP Readings from Last 3 Encounters:  02/27/23 108/66  11/16/22 116/88  10/25/22  102/66    Obesity: Wt Readings from Last 3 Encounters:  02/27/23 140 lb 14.4 oz (63.9 kg)  10/25/22 131 lb (59.4 kg)  10/18/22 131 lb 14.4 oz (59.8 kg)   BMI Readings from Last 3 Encounters:  02/27/23 21.42 kg/m  10/25/22 19.92 kg/m  10/18/22 20.06 kg/m     Lipids:  Lab Results  Component Value Date   CHOL 124 06/09/2020   CHOL 122 02/24/2015   CHOL CANCELED 02/23/2015   Lab Results  Component Value Date   HDL 44 06/09/2020   Lab Results  Component Value Date   LDLCALC 62 06/09/2020   Lab Results  Component Value Date   TRIG 92 06/09/2020   Lab Results  Component Value Date   CHOLHDL 2.8 06/09/2020   No results found for: "LDLDIRECT" Glucose:  Glucose, Bld  Date Value Ref Range Status  03/31/2022 88 70 - 99 mg/dL Final    Comment:    Glucose reference range applies only  to samples taken after fasting for at least 8 hours.  10/26/2021 82 65 - 99 mg/dL Final    Comment:    .            Fasting reference interval .   10/13/2020 78 65 - 99 mg/dL Final    Comment:    .            Fasting reference interval .     Flowsheet Row Office Visit from [facility]/27/2023 in [facility]  AUDIT-C Score 1       Single STD testing and prevention (HIV/chl/gon/syphilis):  yes Sexual history: one partner in the past 6 months , uses condoms, bisexual  Hep C Screening: Completed Skin cancer: Discussed monitoring for atypical lesions Colorectal cancer: N/A Prostate cancer: N/A    Vaccines:   HPV: Completed Tdap: 09/30/2018 Shingrix: N/A Pneumonia: N/A Flu: 02/27/2023 COVID-19:None  Advanced Care Planning: A voluntary discussion about advance care planning including the explanation and discussion of advance directives.  Discussed health care proxy and Living will, and the patient was able to identify a health care proxy as Jalik Gellatly (grandmother).  Patient does not have a living will and power of attorney of health care   Patient  Active Problem List   Diagnosis Date Noted   Asthma, well controlled 10/26/2021   Thrombocytopenia (HCC) 12/08/2020   Major depression, recurrent, chronic (HCC) 06/09/2020   Eczema 02/23/2015   Attention-deficit hyperactivity disorder, predominantly hyperactive type 02/23/2015   Allergic rhinitis, seasonal 02/23/2015   Asthma, mild intermittent 02/23/2015   Learning problem 02/23/2015    History reviewed. No pertinent surgical history.  Family History  Problem Relation Age of Onset   Asthma Sister    Seizures Sister     Social History   Socioeconomic History   Marital status: Single    Spouse name: Not on file   Number of children: 0   Years of education: Not on file   Highest education level: Associate degree: academic program  Occupational History   Occupation: unemployed  Tobacco Use   Smoking status: Never   Smokeless tobacco: Never  Vaping Use   Vaping status: Never Used  Substance and Sexual Activity   Alcohol use: No    Alcohol/week: 0.0 standard drinks of alcohol   Drug use: No   Sexual activity: Yes    Partners: Male, Male    Birth control/protection: Condom    Comment: bi-curious   Other Topics Concern   Not on file  Social History Narrative   Graduated HS Spring 2017   Went to [employer], tried to transfer to [employer] but fell through - lack of paperwork    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    Social Determinants of Corporate investment banker Strain: Medium Risk (02/27/2023)   Overall Financial Resource Strain (CARDIA)    Difficulty of Paying Living Expenses: Somewhat hard  Food Insecurity: No Food Insecurity (02/27/2023)   Hunger Vital Sign    Worried About Running Out of Food in the Last Year: Never true    Ran Out of Food in the Last Year: Never true  Transportation Needs: Unmet Transportation Needs (02/27/2023)   PRAPARE - Transportation    Lack of Transportation (Medical): No    Lack of Transportation  (Non-Medical): Yes  Physical Activity: Insufficiently Active (02/27/2023)   Exercise Vital Sign    Days of Exercise per Week: 2 days  Minutes of Exercise per Session: 20 min  Stress: Stress Concern Present (02/27/2023)   [employer] of Occupational Health - Occupational Stress Questionnaire    Feeling of Stress : To some extent  Social Connections: Moderately Isolated (02/27/2023)   Social Connection and Isolation Panel [NHANES]    Frequency of Communication with Friends and Family: More than three times a week    Frequency of Social Gatherings with Friends and Family: Twice a week    Attends Religious Services: More than 4 times per year    Active Member of [employer] or Organizations: No    Attends Banker Meetings: Never    Marital Status: Never married  Intimate Partner Violence: Not At Risk (02/27/2023)   Humiliation, Afraid, Rape, and Kick questionnaire    Fear of Current or Ex-Partner: No    Emotionally Abused: No    Physically Abused: No    Sexually Abused: No     Current Outpatient Medications:    albuterol (VENTOLIN HFA) 108 (90 Base) MCG/ACT inhaler, Inhale 2 puffs into the lungs every 6 (six) hours as needed for wheezing or shortness of breath., Disp: 18 g, Rfl: 0   buPROPion (WELLBUTRIN XL) 150 MG 24 hr tablet, Take 1 tablet (150 mg total) by mouth at bedtime., Disp: 90 tablet, Rfl: 1   emtricitabine-tenofovir (TRUVADA) 200-300 MG tablet, Take 1 tablet by mouth daily., Disp: 90 tablet, Rfl: 1   escitalopram (LEXAPRO) 10 MG tablet, Take 1 tablet (10 mg total) by mouth daily., Disp: 90 tablet, Rfl: 1   fluticasone-salmeterol (ADVAIR) 100-50 MCG/ACT AEPB, Inhale 1 puff into the lungs 2 (two) times daily., Disp: 1 each, Rfl: 5   lisdexamfetamine (VYVANSE) 50 MG capsule, Take 1 capsule (50 mg total) by mouth daily., Disp: 30 capsule, Rfl: 0   loratadine (CLARITIN) 10 MG tablet, Take 1 tablet (10 mg total) by mouth daily., Disp: 90 tablet, Rfl: 3   naproxen  (NAPROSYN) 500 MG tablet, Take 1 tablet (500 mg total) by mouth 2 (two) times daily with a meal., Disp: 14 tablet, Rfl: 0   cyclobenzaprine (FLEXERIL) 5 MG tablet, 1 tablet every 8 hours as needed for muscle spasms (Patient not taking: Reported on 02/27/2023), Disp: 15 tablet, Rfl: 0  No Known Allergies   ROS  Constitutional: Negative for fever, positive for  weight change.  Respiratory: Negative for cough and shortness of breath.   Cardiovascular: Negative for chest pain or palpitations.  Gastrointestinal: Negative for abdominal pain, no bowel changes.  Musculoskeletal: Negative for gait problem or joint swelling.  Skin: Negative for rash.  Neurological: Negative for dizziness or headache.  No other specific complaints in a complete review of systems (except as listed in HPI above).    Objective  Vitals:   02/27/23 1440  BP: 108/66  Pulse: 83  Resp: 12  Temp: 97.6 F (36.4 C)  TempSrc: Oral  SpO2: 96%  Weight: 140 lb 14.4 oz (63.9 kg)  Height: 5\' 8"  (1.727 m)    Body mass index is 21.42 kg/m.  Physical Exam  Constitutional: Patient appears well-developed and well-nourished. No distress.  HENT: Head: Normocephalic and atraumatic. Ears: B TMs ok, no erythema or effusion; Nose: Nose normal. Mouth/Throat: Oropharynx is clear and moist. No oropharyngeal exudate.  Eyes: Conjunctivae and EOM are normal. Pupils are equal, round, and reactive to light. No scleral icterus.  Neck: Normal range of motion. Neck supple. No JVD present. No thyromegaly present.  Cardiovascular: Normal rate, regular rhythm and normal heart sounds.  No murmur heard. No BLE edema. Pulmonary/Chest: Effort normal and breath sounds normal. No respiratory distress. Abdominal: Soft. Bowel sounds are normal, no distension. There is no tenderness. no masses MALE GENITALIA: Normal descended testes bilaterally, no masses palpated, no hernias, wart lesions on gland penis,  no discharge RECTAL:not done   Musculoskeletal: Normal range of motion, no joint effusions. No gross deformities Neurological: he is alert and oriented to person, place, and time. No cranial nerve deficit. Coordination, balance, strength, speech and gait are normal.  Skin: Skin is warm and dry. Rash near hair line, also dandruff noticed No erythema.  Psychiatric: Patient has a normal mood and affect. behavior is normal. Judgment and thought content normal.   Fall Risk:    02/27/2023    2:47 PM 10/18/2022   11:39 AM 06/27/2022    1:52 PM 03/31/2022   11:45 AM 02/07/2022    2:03 PM  Fall Risk   Falls in the past year? 0 0 0 0 0  Number falls in past yr:  0 0  0  Injury with Fall?  0 0  0  Risk for fall due to : No Fall Risks No Fall Risks No Fall Risks No Fall Risks No Fall Risks  Follow up Falls prevention discussed;Education provided;Falls evaluation completed Falls prevention discussed;Education provided;Falls evaluation completed Falls prevention discussed Falls prevention discussed;Education provided;Falls evaluation completed Falls prevention discussed     Functional Status Survey: Is the patient deaf or have difficulty hearing?: No Does the patient have difficulty seeing, even when wearing glasses/contacts?: No Does the patient have difficulty concentrating, remembering, or making decisions?: No Does the patient have difficulty walking or climbing stairs?: No Does the patient have difficulty dressing or bathing?: No Does the patient have difficulty doing errands alone such as visiting a doctor's office or shopping?: No    Assessment & Plan  1. Well adult exam  - Flu vaccine trivalent PF, 6mos and older(Flulaval,Afluria,Fluarix,Fluzone) - Urine cytology ancillary only - RPR - Hemoglobin A1c - COMPLETE METABOLIC PANEL WITH GFR - B12 and Folate Panel - Lipid panel - HIV Antibody (routine testing w rflx) - Hepatitis, Acute  2. Low serum vitamin B12  - B12 and Folate Panel  3. Thrombocytopenia  (HCC)  CBC  4. Need for immunization against influenza  - Flu vaccine trivalent PF, 6mos and older(Flulaval,Afluria,Fluarix,Fluzone)  5. Screen for STD (sexually transmitted disease)  - Urine cytology ancillary only - RPR - HIV Antibody (routine testing w rflx) - Hepatitis, Acute  6. Lipid screening  - Lipid panel  7. Diabetes mellitus screening  - Hemoglobin A1c  8. Penile wart  - imiquimod (ALDARA) 5 % cream; Apply topically 3 (three) times a week.  Dispense: 12 each; Refill: 0  9. Seborrhea capitis in adult  - ketoconazole (NIZORAL) 2 % shampoo; Apply 1 Application topically 2 (two) times a week.  Dispense: 120 mL; Refill: 0    -Prostate cancer screening and PSA options (with potential risks and benefits of testing vs not testing) were discussed along with recent recs/guidelines. -USPSTF grade A and B recommendations reviewed with patient; age-appropriate recommendations, preventive care, screening tests, etc discussed and encouraged; healthy living encouraged; see AVS for patient education given to patient -Discussed importance of 150 minutes of physical activity weekly, eat two servings of fish weekly, eat one serving of tree nuts ( cashews, pistachios, pecans, almonds.Marland Kitchen) every other day, eat 6 servings of fruit/vegetables daily and drink plenty of water and avoid sweet beverages.  -Reviewed Health Maintenance:  yes

## 2023-02-27 ENCOUNTER — Encounter: Payer: Self-pay | Admitting: Family Medicine

## 2023-02-27 ENCOUNTER — Ambulatory Visit (INDEPENDENT_AMBULATORY_CARE_PROVIDER_SITE_OTHER): Payer: Medicaid Other | Admitting: Family Medicine

## 2023-02-27 ENCOUNTER — Other Ambulatory Visit (HOSPITAL_COMMUNITY)
Admission: RE | Admit: 2023-02-27 | Discharge: 2023-02-27 | Disposition: A | Discharge status: Home / Self Care | Payer: Medicaid Other | Source: Ambulatory Visit | Attending: Family Medicine | Admitting: Family Medicine

## 2023-02-27 ENCOUNTER — Other Ambulatory Visit: Payer: Self-pay

## 2023-02-27 VITALS — BP 108/66 | HR 83 | Temp 97.6°F | Resp 12 | Ht 68.0 in | Wt 140.9 lb

## 2023-02-27 DIAGNOSIS — Z113 Encounter for screening for infections with a predominantly sexual mode of transmission: Secondary | ICD-10-CM | POA: Diagnosis not present

## 2023-02-27 DIAGNOSIS — Z13220 Encounter for screening for lipoid disorders: Secondary | ICD-10-CM | POA: Diagnosis not present

## 2023-02-27 DIAGNOSIS — L210 Seborrhea capitis: Secondary | ICD-10-CM | POA: Diagnosis not present

## 2023-02-27 DIAGNOSIS — Z0000 Encounter for general adult medical examination without abnormal findings: Secondary | ICD-10-CM

## 2023-02-27 DIAGNOSIS — Z23 Encounter for immunization: Secondary | ICD-10-CM

## 2023-02-27 DIAGNOSIS — E538 Deficiency of other specified B group vitamins: Secondary | ICD-10-CM | POA: Diagnosis not present

## 2023-02-27 DIAGNOSIS — Z131 Encounter for screening for diabetes mellitus: Secondary | ICD-10-CM

## 2023-02-27 DIAGNOSIS — Z0001 Encounter for general adult medical examination with abnormal findings: Secondary | ICD-10-CM

## 2023-02-27 DIAGNOSIS — A630 Anogenital (venereal) warts: Secondary | ICD-10-CM | POA: Diagnosis not present

## 2023-02-27 DIAGNOSIS — D696 Thrombocytopenia, unspecified: Secondary | ICD-10-CM | POA: Diagnosis not present

## 2023-02-27 MED ORDER — IMIQUIMOD 5 % EX CREA
TOPICAL_CREAM | CUTANEOUS | 0 refills | Status: DC
Start: 2023-02-28 — End: 2024-03-06

## 2023-02-27 MED ORDER — KETOCONAZOLE 2 % EX SHAM
1.0000 | MEDICATED_SHAMPOO | CUTANEOUS | 0 refills | Status: DC
Start: 2023-03-01 — End: 2023-06-01

## 2023-03-01 LAB — URINE CYTOLOGY ANCILLARY ONLY
Chlamydia: NEGATIVE
Comment: NEGATIVE
Comment: NORMAL
Neisseria Gonorrhea: NEGATIVE

## 2023-03-01 LAB — RPR: RPR Ser Ql: NONREACTIVE

## 2023-03-01 LAB — COMPLETE METABOLIC PANEL WITH GFR
AG Ratio: 1.5 (calc) (ref 1.0–2.5)
ALT: 18 U/L (ref 9–46)
AST: 19 U/L (ref 10–40)
Albumin: 4.6 g/dL (ref 3.6–5.1)
Alkaline phosphatase (APISO): 70 U/L (ref 36–130)
BUN: 8 mg/dL (ref 7–25)
CO2: 29 mmol/L (ref 20–32)
Calcium: 9.6 mg/dL (ref 8.6–10.3)
Chloride: 102 mmol/L (ref 98–110)
Creat: 0.86 mg/dL (ref 0.60–1.24)
Globulin: 3 g/dL (ref 1.9–3.7)
Glucose, Bld: 61 mg/dL — ABNORMAL LOW (ref 65–99)
Potassium: 4.1 mmol/L (ref 3.5–5.3)
Sodium: 140 mmol/L (ref 135–146)
Total Bilirubin: 1.7 mg/dL — ABNORMAL HIGH (ref 0.2–1.2)
Total Protein: 7.6 g/dL (ref 6.1–8.1)
eGFR: 122 mL/min/{1.73_m2} (ref >=60)

## 2023-03-01 LAB — CBC WITH DIFFERENTIAL/PLATELET
Absolute Lymphocytes: 1011 {cells}/uL (ref 850–3900)
Absolute Monocytes: 640 {cells}/uL (ref 200–950)
Basophils Absolute: 32 {cells}/uL (ref 0–200)
Basophils Relative: 0.4 %
Eosinophils Absolute: 103 {cells}/uL (ref 15–500)
Eosinophils Relative: 1.3 %
HCT: 45 % (ref 38.5–50.0)
Hemoglobin: 15.1 g/dL (ref 13.2–17.1)
MCH: 31.3 pg (ref 27.0–33.0)
MCHC: 33.6 g/dL (ref 32.0–36.0)
MCV: 93.4 fL (ref 80.0–100.0)
MPV: 13.5 fL — ABNORMAL HIGH (ref 7.5–12.5)
Monocytes Relative: 8.1 %
Neutro Abs: 6115 {cells}/uL (ref 1500–7800)
Neutrophils Relative %: 77.4 %
Platelets: 185 10*3/uL (ref 140–400)
RBC: 4.82 10*6/uL (ref 4.20–5.80)
RDW: 12 % (ref 11.0–15.0)
Total Lymphocyte: 12.8 %
WBC: 7.9 10*3/uL (ref 3.8–10.8)

## 2023-03-01 LAB — HEPATITIS PANEL, ACUTE
Hep A IgM: NONREACTIVE
Hep B C IgM: NONREACTIVE
Hepatitis B Surface Ag: NONREACTIVE
Hepatitis C Ab: NONREACTIVE

## 2023-03-01 LAB — HEMOGLOBIN A1C
Hgb A1c MFr Bld: 5.5 %{Hb} (ref <5.7)
Mean Plasma Glucose: 111 mg/dL
eAG (mmol/L): 6.2 mmol/L

## 2023-03-01 LAB — LIPID PANEL
Cholesterol: 153 mg/dL (ref <200)
HDL: 54 mg/dL (ref >=40)
LDL Cholesterol (Calc): 80 mg/dL
Non-HDL Cholesterol (Calc): 99 mg/dL (ref <130)
Total CHOL/HDL Ratio: 2.8 (calc) (ref <5.0)
Triglycerides: 108 mg/dL (ref <150)

## 2023-03-01 LAB — CYTOLOGY, (ORAL, ANAL, URETHRAL) ANCILLARY ONLY
Chlamydia: NEGATIVE
Chlamydia: NEGATIVE
Comment: NEGATIVE
Comment: NEGATIVE
Comment: NORMAL
Comment: NORMAL
Neisseria Gonorrhea: NEGATIVE
Neisseria Gonorrhea: NEGATIVE

## 2023-03-01 LAB — B12 AND FOLATE PANEL
Folate: 10 ng/mL
Vitamin B-12: 504 pg/mL (ref 200–1100)

## 2023-03-01 LAB — HIV ANTIBODY (ROUTINE TESTING W REFLEX): HIV 1&2 Ab, 4th Generation: NONREACTIVE

## 2023-06-01 ENCOUNTER — Ambulatory Visit (INDEPENDENT_AMBULATORY_CARE_PROVIDER_SITE_OTHER): Payer: Medicaid Other | Admitting: Family Medicine

## 2023-06-01 ENCOUNTER — Encounter: Payer: Self-pay | Admitting: Family Medicine

## 2023-06-01 ENCOUNTER — Other Ambulatory Visit (HOSPITAL_COMMUNITY)
Admission: RE | Admit: 2023-06-01 | Discharge: 2023-06-01 | Disposition: A | Discharge status: Home / Self Care | Payer: Medicaid Other | Source: Ambulatory Visit | Attending: Family Medicine | Admitting: Family Medicine

## 2023-06-01 VITALS — BP 108/70 | HR 68 | Resp 16 | Ht 68.0 in | Wt 150.1 lb

## 2023-06-01 DIAGNOSIS — L219 Seborrheic dermatitis, unspecified: Secondary | ICD-10-CM | POA: Diagnosis not present

## 2023-06-01 DIAGNOSIS — Z7253 High risk bisexual behavior: Secondary | ICD-10-CM

## 2023-06-01 DIAGNOSIS — J4520 Mild intermittent asthma, uncomplicated: Secondary | ICD-10-CM

## 2023-06-01 DIAGNOSIS — F339 Major depressive disorder, recurrent, unspecified: Secondary | ICD-10-CM | POA: Diagnosis not present

## 2023-06-01 DIAGNOSIS — Z113 Encounter for screening for infections with a predominantly sexual mode of transmission: Secondary | ICD-10-CM | POA: Insufficient documentation

## 2023-06-01 DIAGNOSIS — F901 Attention-deficit hyperactivity disorder, predominantly hyperactive type: Secondary | ICD-10-CM

## 2023-06-01 DIAGNOSIS — L2089 Other atopic dermatitis: Secondary | ICD-10-CM

## 2023-06-01 DIAGNOSIS — L210 Seborrhea capitis: Secondary | ICD-10-CM

## 2023-06-01 MED ORDER — AIRSUPRA 90-80 MCG/ACT IN AERO
2.0000 | INHALATION_SPRAY | Freq: Four times a day (QID) | RESPIRATORY_TRACT | 0 refills | Status: DC
Start: 2023-06-01 — End: 2024-03-06

## 2023-06-01 MED ORDER — LISDEXAMFETAMINE DIMESYLATE 50 MG PO CAPS
50.0000 mg | ORAL_CAPSULE | Freq: Every day | ORAL | 0 refills | Status: DC
Start: 2023-06-01 — End: 2024-03-06

## 2023-06-01 MED ORDER — ESCITALOPRAM OXALATE 10 MG PO TABS: 10.0000 mg | ORAL_TABLET | Freq: Every day | ORAL | 1 refills | Status: DC

## 2023-06-01 MED ORDER — ZORYVE 0.3 % EX FOAM
1.0000 | Freq: Every day | CUTANEOUS | 2 refills | Status: AC
Start: 2023-06-01 — End: ?

## 2023-06-01 MED ORDER — KETOCONAZOLE 2 % EX SHAM: 1.0000 | MEDICATED_SHAMPOO | CUTANEOUS | 0 refills | Status: DC

## 2023-06-01 MED ORDER — BUPROPION HCL ER (XL) 150 MG PO TB24: 150.0000 mg | ORAL_TABLET | Freq: Every day | ORAL | 1 refills | Status: DC

## 2023-06-01 MED ORDER — EMTRICITABINE-TENOFOVIR DF 200-300 MG PO TABS
1.0000 | ORAL_TABLET | Freq: Every day | ORAL | 1 refills | Status: DC
Start: 2023-06-01 — End: 2024-03-03

## 2023-06-01 NOTE — Progress Notes (Signed)
Name: Benjamin Pitts   MRN: [id number]    DOB: Dec 21, 1995   Date:06/01/2023       Progress Note  Subjective  Chief Complaint  Chief Complaint  Patient presents with   Medical Management of Chronic Issues   Discussed the use of AI scribe software for clinical note transcription with the patient, who gave verbal consent to proceed.  History of Present Illness   The patient, who has a history of ADHD, major depressive disorder, and asthma, presented for a seven-month follow-up visit. He reported that he has been taking Vyvanse for ADHD, but not consistently. He takes it on work days and when he has important tasks to complete, but skips it on his days off. He reported that the medication helps him stay focused at work and he requested a three-month supply.  The patient also reported taking two antidepressants, Wellbutrin and Lexapro, daily for his major depressive disorder. He reported that these medications help him maintain energy and focus. He also reported that he prays over his medications before taking them, which he believes helps him maintain his mental stability. He reported that his depression is currently under control.  The patient reported that he has not been taking his prescribed Advair for asthma recently. He reported that his asthma symptoms are not as frequent as they used to be and are now very random. He reported using a nebulizer treatment when needed. He requested a rescue inhaler refill for asthma exacerbations.  The patient also reported having seborrhea, which primarily affects his scalp but also neck and forehead. He has been using ketoconazole shampoo, which he applies and lets sit for ten minutes before rinsing a couple of times a week, but still has symptoms.  He requested an additional medication to help manage his seborrhea.  The patient also reported taking Truvada due to his sexual behavior and reported using condoms consistently. He requested STI testing,  including HIV and syphilis, and a urine test for gonorrhea and chlamydia. He also reported having had a wart on his penis, which was treated with Aldara cream and has since resolved.         Patient Active Problem List   Diagnosis Date Noted   Asthma, well controlled 10/26/2021   Major depression, recurrent, chronic (HCC) 06/09/2020   Eczema 02/23/2015   Attention-deficit hyperactivity disorder, predominantly hyperactive type 02/23/2015   Allergic rhinitis, seasonal 02/23/2015   Asthma, mild intermittent 02/23/2015   Learning problem 02/23/2015    History reviewed. No pertinent surgical history.  Family History  Problem Relation Age of Onset   Asthma Sister    Seizures Sister     Social History   Tobacco Use   Smoking status: Never   Smokeless tobacco: Never  Substance Use Topics   Alcohol use: No    Alcohol/week: 0.0 standard drinks of alcohol     Current Outpatient Medications:    buPROPion (WELLBUTRIN XL) 150 MG 24 hr tablet, Take 1 tablet (150 mg total) by mouth at bedtime., Disp: 90 tablet, Rfl: 1   emtricitabine-tenofovir (TRUVADA) 200-300 MG tablet, Take 1 tablet by mouth daily., Disp: 90 tablet, Rfl: 1   escitalopram (LEXAPRO) 10 MG tablet, Take 1 tablet (10 mg total) by mouth daily., Disp: 90 tablet, Rfl: 1   imiquimod (ALDARA) 5 % cream, Apply topically 3 (three) times a week., Disp: 12 each, Rfl: 0   ketoconazole (NIZORAL) 2 % shampoo, Apply 1 Application topically 2 (two) times a week., Disp: 120 mL, Rfl: 0  lisdexamfetamine (VYVANSE) 50 MG capsule, Take 1 capsule (50 mg total) by mouth daily., Disp: 30 capsule, Rfl: 0   loratadine (CLARITIN) 10 MG tablet, Take 1 tablet (10 mg total) by mouth daily., Disp: 90 tablet, Rfl: 3  No Known Allergies  I personally reviewed active problem list, medication list, allergies with the patient/caregiver today.   ROS  Ten systems reviewed and is negative except as mentioned in HPI    Objective  Vitals:   06/01/23  1357  BP: 108/70  Pulse: 68  Resp: 16  SpO2: 98%  Weight: 150 lb 1.6 oz (68.1 kg)  Height: 5\' 8"  (1.727 m)    Body mass index is 22.82 kg/m.  Physical Exam  Constitutional: Patient appears well-developed and well-nourished.  No distress.  HEENT: head atraumatic, normocephalic, pupils equal and reactive to light, neck supple Cardiovascular: Normal rate, regular rhythm and normal heart sounds.  No murmur heard. No BLE edema. Pulmonary/Chest: Effort normal and breath sounds normal. No respiratory distress. Abdominal: Soft.  There is no tenderness. Psychiatric: Patient has a normal mood and affect. behavior is normal. Judgment and thought content normal.   Diabetic Foot Exam:     PHQ2/9:    06/01/2023    1:56 PM 02/27/2023    2:43 PM 10/18/2022   11:40 AM 06/27/2022    1:53 PM 03/31/2022   11:45 AM  Depression screen PHQ 2/9  Decreased Interest 0 0 0 1 0  Down, Depressed, Hopeless 0 0 0 0 0  PHQ - 2 Score 0 0 0 1 0  Altered sleeping 0 1 0 0 0  Tired, decreased energy 0 2 0 2 0  Change in appetite 0 2 0 3 0  Feeling bad or failure about yourself  0 0 0 0 0  Trouble concentrating 0 1 0 0 0  Moving slowly or fidgety/restless 0 2 0 0 0  Suicidal thoughts 0 0 0 0 0  PHQ-9 Score 0 8 0 6 0  Difficult doing work/chores Not difficult at all Somewhat difficult Not difficult at all      phq 9 is negative  Fall Risk:    06/01/2023    1:53 PM 02/27/2023    2:47 PM 10/18/2022   11:39 AM 06/27/2022    1:52 PM 03/31/2022   11:45 AM  Fall Risk   Falls in the past year? 0 0 0 0 0  Number falls in past yr: 0  0 0   Injury with Fall? 0  0 0   Risk for fall due to : No Fall Risks No Fall Risks No Fall Risks No Fall Risks No Fall Risks  Follow up Falls prevention discussed;Education provided;Falls evaluation completed Falls prevention discussed;Education provided;Falls evaluation completed Falls prevention discussed;Education provided;Falls evaluation completed Falls prevention discussed  Falls prevention discussed;Education provided;Falls evaluation completed     Assessment and Plan    ADHD Reports inconsistent use of Vyvanse 50mg , primarily taking it before work shifts. No reported side effects. -Continue Vyvanse 50mg  as needed, with three months' supply sent to pharmacy.  Major Depressive Disorder recurrent in remission Reports consistent use of Wellbutrin 150mg  and Lexapro 10mg  with good control of depressive symptoms. No reported side effects. -Continue Wellbutrin 150mg  and Lexapro 10mg  daily.  Asthma Reports intermittent symptoms, not currently using Advair. No current wheezing, cough, or shortness of breath. -Discontinue Advair. -Prescribe AirSuppra  for use during asthma exacerbations.  Seborrheic Dermatitis Reports symptoms primarily on scalp. Currently using ketoconazole shampoo. -Continue ketoconazole shampoo. -Prescribe  new topical medication, Zoryve for use on scalp and face.  Pre-exposure Prophylaxis (PrEP) for HIV Reports consistent use of Truvada with no side effects. -Continue Truvada daily.  Sexually Transmitted Infection (STI) Screening Reports use of condoms with multiple partners. -Order HIV and syphilis blood tests, urine gonorrhea and chlamydia tests, and rectal swab.  Atopic Dermatitis  Reports dry hands, possibly related to work. No current itching spots. -Prescribe new topical medication called Zoryve  for use on hands.

## 2023-06-02 LAB — HIV ANTIBODY (ROUTINE TESTING W REFLEX): HIV 1&2 Ab, 4th Generation: NONREACTIVE

## 2023-06-02 LAB — RPR: RPR Ser Ql: NONREACTIVE

## 2023-06-05 LAB — CYTOLOGY, (ORAL, ANAL, URETHRAL) ANCILLARY ONLY
Chlamydia: NEGATIVE
Comment: NEGATIVE
Comment: NORMAL
Neisseria Gonorrhea: NEGATIVE

## 2023-06-05 LAB — URINE CYTOLOGY ANCILLARY ONLY
Chlamydia: NEGATIVE
Comment: NEGATIVE
Comment: NORMAL
Neisseria Gonorrhea: NEGATIVE

## 2023-06-07 ENCOUNTER — Encounter: Payer: Self-pay | Admitting: Family Medicine

## 2023-08-21 ENCOUNTER — Ambulatory Visit
Admission: EM | Admit: 2023-08-21 | Discharge: 2023-08-21 | Disposition: A | Discharge status: Home / Self Care | Attending: Emergency Medicine | Admitting: Emergency Medicine

## 2023-08-21 ENCOUNTER — Encounter: Payer: Self-pay | Admitting: Emergency Medicine

## 2023-08-21 ENCOUNTER — Other Ambulatory Visit: Payer: Self-pay

## 2023-08-21 ENCOUNTER — Ambulatory Visit (INDEPENDENT_AMBULATORY_CARE_PROVIDER_SITE_OTHER)

## 2023-08-21 ENCOUNTER — Ambulatory Visit: Payer: Self-pay | Admitting: Family Medicine

## 2023-08-21 DIAGNOSIS — G8929 Other chronic pain: Secondary | ICD-10-CM | POA: Diagnosis not present

## 2023-08-21 DIAGNOSIS — M546 Pain in thoracic spine: Secondary | ICD-10-CM

## 2023-08-21 MED ORDER — PREDNISONE 10 MG (21) PO TBPK: ORAL_TABLET | Freq: Every day | ORAL | 0 refills | Status: DC

## 2023-08-21 MED ORDER — CYCLOBENZAPRINE HCL 10 MG PO TABS: 10.0000 mg | ORAL_TABLET | Freq: Every day | ORAL | 0 refills | Status: DC
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
## 2023-08-21 NOTE — ED Notes (Signed)
 Patient waiting for xray prior to discharge
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
## 2023-08-21 NOTE — Discharge Instructions (Addendum)
 X-rays pending, you will be notified of results via telephone  Begin prednisone starting tomorrow, take every morning with food as directed, helps to reduce internal inflammation and helps with pain, may use Tylenol or any topical medicines additionally  May use muscle relaxant at bedtime as needed for additional comfort, be mindful this will make you feel sleepy  You may use heating pad in 15 minute intervals as needed for additional comfort, or  you may find comfort in using ice in 10-15 minutes over affected area  Begin stretching affected area daily for 10 minutes as tolerated to further loosen muscles   When lying down place pillow underneath and between knees for support  Can try sleeping without pillow on firm mattress   Practice good posture: head back, shoulders back, chest forward, pelvis back and weight distributed evenly on both legs  If pain persist after recommended treatment or reoccurs if may be beneficial to follow up with orthopedic specialist for evaluation, this doctor specializes in the bones and can manage your symptoms long-term with options such as but not limited to imaging, medications or physical therapy

## 2023-09-03 ENCOUNTER — Ambulatory Visit: Payer: Self-pay | Admitting: Family Medicine

## 2024-02-29 NOTE — Patient Instructions (Signed)
 Preventive Care 11-27 Years Old, Male Preventive care refers to lifestyle choices and visits with your health care provider that can promote health and wellness. Preventive care visits are also called wellness exams. What can I expect for my preventive care visit? Counseling During your preventive care visit, your health care provider may ask about your: Medical history, including: Past medical problems. Family medical history. Current health, including: Emotional well-being. Home life and relationship well-being. Sexual activity. Lifestyle, including: Alcohol, nicotine or tobacco, and drug use. Access to firearms. Diet, exercise, and sleep habits. Safety issues such as seatbelt and bike helmet use. Sunscreen use. Work and work Astronomer. Physical exam Your health care provider may check your: Height and weight. These may be used to calculate your BMI (body mass index). BMI is a measurement that tells if you are at a healthy weight. Waist circumference. This measures the distance around your waistline. This measurement also tells if you are at a healthy weight and may help predict your risk of certain diseases, such as type 2 diabetes and high blood pressure. Heart rate and blood pressure. Body temperature. Skin for abnormal spots. What immunizations do I need?  Vaccines are usually given at various ages, according to a schedule. Your health care provider will recommend vaccines for you based on your age, medical history, and lifestyle or other factors, such as travel or where you work. What tests do I need? Screening Your health care provider may recommend screening tests for certain conditions. This may include: Lipid and cholesterol levels. Diabetes screening. This is done by checking your blood sugar (glucose) after you have not eaten for a while (fasting). Hepatitis B test. Hepatitis C test. HIV (human immunodeficiency virus) test. STI (sexually transmitted infection)  testing, if you are at risk. Talk with your health care provider about your test results, treatment options, and if necessary, the need for more tests. Follow these instructions at home: Eating and drinking  Eat a healthy diet that includes fresh fruits and vegetables, whole grains, lean protein, and low-fat dairy products. Drink enough fluid to keep your urine pale yellow. Take vitamin and mineral supplements as recommended by your health care provider. Do not drink alcohol if your health care provider tells you not to drink. If you drink alcohol: Limit how much you have to 0-2 drinks a day. Know how much alcohol is in your drink. In the [place] one drink equals one 12 oz bottle of beer (355 mL), one 5 oz glass of wine (148 mL), or one 1 oz glass of hard liquor (44 mL). Lifestyle Brush your teeth every morning and night with fluoride toothpaste. Floss one time each day. Exercise for at least 30 minutes 5 or more days each week. Do not use any products that contain nicotine or tobacco. These products include cigarettes, chewing tobacco, and vaping devices, such as e-cigarettes. If you need help quitting, ask your health care provider. Do not use drugs. If you are sexually active, practice safe sex. Use a condom or other form of protection to prevent STIs. Find healthy ways to manage stress, such as: Meditation, yoga, or listening to music. Journaling. Talking to a trusted person. Spending time with friends and family. Minimize exposure to UV radiation to reduce your risk of skin cancer. Safety Always wear your seat belt while driving or riding in a vehicle. Do not drive: If you have been drinking alcohol. Do not ride with someone who has been drinking. If you have been using any mind-altering substances  or drugs. While texting. When you are tired or distracted. Wear a helmet and other protective equipment during sports activities. If you have firearms in your house, make sure you  follow all gun safety procedures. Seek help if you have been physically or sexually abused. What's next? Go to your health care provider once a year for an annual wellness visit. Ask your health care provider how often you should have your eyes and teeth checked. Stay up to date on all vaccines. This information is not intended to replace advice given to you by your health care provider. Make sure you discuss any questions you have with your health care provider. Document Revised: 10/27/2020 Document Reviewed: 10/27/2020 Elsevier Patient Education  2024 [employer].

## 2024-03-03 ENCOUNTER — Ambulatory Visit: Payer: Self-pay | Admitting: Family Medicine

## 2024-03-03 ENCOUNTER — Other Ambulatory Visit (HOSPITAL_COMMUNITY)
Admission: RE | Admit: 2024-03-03 | Discharge: 2024-03-03 | Disposition: A | Discharge status: Home / Self Care | Source: Ambulatory Visit | Attending: Family Medicine | Admitting: Family Medicine

## 2024-03-03 ENCOUNTER — Encounter: Payer: Self-pay | Admitting: Family Medicine

## 2024-03-03 VITALS — BP 110/66 | HR 54 | Resp 16 | Ht 68.0 in | Wt 151.0 lb

## 2024-03-03 DIAGNOSIS — Z0000 Encounter for general adult medical examination without abnormal findings: Secondary | ICD-10-CM | POA: Diagnosis not present

## 2024-03-03 DIAGNOSIS — Z23 Encounter for immunization: Secondary | ICD-10-CM | POA: Diagnosis not present

## 2024-03-03 DIAGNOSIS — Z113 Encounter for screening for infections with a predominantly sexual mode of transmission: Secondary | ICD-10-CM

## 2024-03-03 DIAGNOSIS — Z7253 High risk bisexual behavior: Secondary | ICD-10-CM | POA: Diagnosis not present

## 2024-03-03 MED ORDER — DOXYCYCLINE HYCLATE 100 MG PO TABS
200.0000 mg | ORAL_TABLET | Freq: Once | ORAL | 0 refills | Status: AC | PRN
Start: 2024-03-03 — End: ?

## 2024-03-03 MED ORDER — EMTRICITABINE-TENOFOVIR DF 200-300 MG PO TABS
1.0000 | ORAL_TABLET | Freq: Every day | ORAL | 1 refills | Status: AC
Start: 2024-03-03 — End: ?

## 2024-03-03 NOTE — Progress Notes (Signed)
 Name: Benjamin Pitts   MRN: [id number]    DOB: [birth year]   Date:03/03/2024       Progress Note  Subjective  Chief Complaint  Chief Complaint  Patient presents with   Annual Exam    HPI  Patient presents for annual CPE .    Diet: works at [employer] and eats at least one meal a day from there ,  Exercise: continue regular activity Last Dental Exam: up to date Last Eye Exam: up to date   Depression: phq 9 is negative    03/03/2024    2:24 PM 06/01/2023    1:56 PM 02/27/2023    2:43 PM 10/18/2022   11:40 AM 06/27/2022    1:53 PM  Depression screen PHQ 2/9  Decreased Interest 0 0 0 0 1  Down, Depressed, Hopeless 0 0 0 0 0  PHQ - 2 Score 0 0 0 0 1  Altered sleeping 0 0 1 0 0  Tired, decreased energy 0 0 2 0 2  Change in appetite 0 0 2 0 3  Feeling bad or failure about yourself  0 0 0 0 0  Trouble concentrating 0 0 1 0 0  Moving slowly or fidgety/restless 0 0 2 0 0  Suicidal thoughts 0 0 0 0 0  PHQ-9 Score 0 0 8 0 6  Difficult doing work/chores  Not difficult at all Somewhat difficult Not difficult at all     Hypertension:  BP Readings from Last 3 Encounters:  03/03/24 110/66  08/21/23 119/74  06/01/23 108/70    Obesity: Wt Readings from Last 3 Encounters:  03/03/24 151 lb (68.5 kg)  06/01/23 150 lb 1.6 oz (68.1 kg)  02/27/23 140 lb 14.4 oz (63.9 kg)   BMI Readings from Last 3 Encounters:  03/03/24 22.96 kg/m  06/01/23 22.82 kg/m  02/27/23 21.42 kg/m     [employer] Visit from 03/03/2024 in [facility]  AUDIT-C Score 1     Single STD testing and prevention (HIV/chl/gon/syphilis):  yes Sexual history: mostly same sex, sometimes females  Hep C Screening: completed Skin cancer: Discussed monitoring for atypical lesions Colorectal cancer: N/A Prostate cancer:  not applicable    Lung cancer:  Low Dose CT Chest recommended if Age 65-80 years, 30 pack-year currently smoking OR have quit w/in 15years. Patient   is not a candidate for screening   AAA: The USPSTF recommends one-time screening with ultrasonography in men ages 36 to 75 years who have ever smoked. Patient   is not a candidate for screening    Vaccines: reviewed with the patient.   Advanced Care Planning: A voluntary discussion about advance care planning including the explanation and discussion of advance directives.  Discussed health care proxy and Living will, and the patient was able to identify a health care proxy as grandmother .  Patient does not have a living will and power of attorney of health care   Patient Active Problem List   Diagnosis Date Noted   Asthma, well controlled 10/26/2021   Major depression, recurrent, chronic 06/09/2020   Eczema 02/23/2015   Attention-deficit hyperactivity disorder, predominantly hyperactive type 02/23/2015   Allergic rhinitis, seasonal 02/23/2015   Asthma, mild intermittent 02/23/2015   Learning problem 02/23/2015    History reviewed. No pertinent surgical history.  Family History  Problem Relation Age of Onset   Asthma Sister    Seizures Sister     Social History   Socioeconomic History   Marital status:  Single    Spouse name: Not on file   Number of children: 0   Years of education: Not on file   Highest education level: Associate degree: academic program  Occupational History   Occupation: unemployed  Tobacco Use   Smoking status: Never   Smokeless tobacco: Never  Vaping Use   Vaping status: Never Used  Substance and Sexual Activity   Alcohol use: No    Alcohol/week: 0.0 standard drinks of alcohol   Drug use: No   Sexual activity: Yes    Partners: Male, Male    Birth control/protection: Condom    Comment: bi-curious   Other Topics Concern   Not on file  Social History Narrative   Graduated HS Spring 2017   Went to [employer], tried to transfer to [employer] but fell through - lack of paperwork    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     Social Drivers of Corporate investment banker Strain: Medium Risk (03/03/2024)   Overall Financial Resource Strain (CARDIA)    Difficulty of Paying Living Expenses: Somewhat hard  Food Insecurity: No Food Insecurity (03/03/2024)   Hunger Vital Sign    Worried About Running Out of Food in the Last Year: Never true    Ran Out of Food in the Last Year: Never true  Transportation Needs: No Transportation Needs (03/03/2024)   PRAPARE - Administrator, Civil Service (Medical): No    Lack of Transportation (Non-Medical): No  Physical Activity: Insufficiently Active (03/03/2024)   Exercise Vital Sign    Days of Exercise per Week: 2 days    Minutes of Exercise per Session: 20 min  Stress: No Stress Concern Present (03/03/2024)   [employer] of Occupational Health - Occupational Stress Questionnaire    Feeling of Stress: Only a little  Social Connections: Moderately Isolated (03/03/2024)   Social Connection and Isolation Panel    Frequency of Communication with Friends and Family: More than three times a week    Frequency of Social Gatherings with Friends and Family: Twice a week    Attends Religious Services: More than 4 times per year    Active Member of [employer] or Organizations: No    Attends Banker Meetings: Never    Marital Status: Never married  Intimate Partner Violence: Not At Risk (03/03/2024)   Humiliation, Afraid, Rape, and Kick questionnaire    Fear of Current or Ex-Partner: No    Emotionally Abused: No    Physically Abused: No    Sexually Abused: No     Current Outpatient Medications:    Albuterol -Budesonide (AIRSUPRA ) 90-80 MCG/ACT AERO, Inhale 2 puffs into the lungs 4 (four) times daily., Disp: 10.7 g, Rfl: 0   buPROPion  (WELLBUTRIN  XL) 150 MG 24 hr tablet, Take 1 tablet (150 mg total) by mouth at bedtime., Disp: 90 tablet, Rfl: 1   emtricitabine -tenofovir  (TRUVADA ) 200-300 MG tablet, Take 1 tablet by mouth daily., Disp: 90 tablet, Rfl:  1   escitalopram  (LEXAPRO ) 10 MG tablet, Take 1 tablet (10 mg total) by mouth daily., Disp: 90 tablet, Rfl: 1   imiquimod  (ALDARA ) 5 % cream, Apply topically 3 (three) times a week., Disp: 12 each, Rfl: 0   ketoconazole  (NIZORAL ) 2 % shampoo, Apply 1 Application topically 2 (two) times a week., Disp: 120 mL, Rfl: 0   lisdexamfetamine (VYVANSE ) 50 MG capsule, Take 1 capsule (50 mg total) by mouth daily., Disp: 30 capsule,  Rfl: 0   lisdexamfetamine (VYVANSE ) 50 MG capsule, Take 1 capsule (50 mg total) by mouth daily., Disp: 30 capsule, Rfl: 0   lisdexamfetamine (VYVANSE ) 50 MG capsule, Take 1 capsule (50 mg total) by mouth daily., Disp: 30 capsule, Rfl: 0   loratadine  (CLARITIN ) 10 MG tablet, Take 1 tablet (10 mg total) by mouth daily., Disp: 90 tablet, Rfl: 3   Roflumilast , Antiseborrheic, (ZORYVE ) 0.3 % FOAM, Apply 1 each topically daily at 12 noon., Disp: 60 g, Rfl: 2  No Known Allergies   ROS  Constitutional: Negative for fever or weight change.  Respiratory: Negative for cough and shortness of breath.   Cardiovascular: Negative for chest pain or palpitations.  Gastrointestinal: Negative for abdominal pain, no bowel changes.  Musculoskeletal: Negative for gait problem or joint swelling.  Skin: Negative for rash.  Neurological: Negative for dizziness or headache.  No other specific complaints in a complete review of systems (except as listed in HPI above).   Objective  Vitals:   03/03/24 1425  BP: 110/66  Pulse: (!) 54  Resp: 16  SpO2: 98%  Weight: 151 lb (68.5 kg)  Height: 5' 8 (1.727 m)    Body mass index is 22.96 kg/m.  Physical Exam  Constitutional: Patient appears well-developed and well-nourished. No distress.  HENT: Head: Normocephalic and atraumatic. Ears: B TMs ok, no erythema or effusion; Nose: Nose normal. Mouth/Throat: Oropharynx is clear and moist. No oropharyngeal exudate.  Eyes: Conjunctivae and EOM are normal. Pupils are equal, round, and reactive to  light. No scleral icterus.  Neck: Normal range of motion. Neck supple. No JVD present. No thyromegaly present.  Cardiovascular: Normal rate, regular rhythm and normal heart sounds.  No murmur heard. No BLE edema. Pulmonary/Chest: Effort normal and breath sounds normal. No respiratory distress. Abdominal: Soft. Bowel sounds are normal, no distension. There is no tenderness. no masses MALE GENITALIA: Normal descended testes bilaterally, no masses palpated, no hernias, no lesions, no discharge RECTAL:anal exam normal  Musculoskeletal: Normal range of motion, no joint effusions. No gross deformities Neurological: he is alert and oriented to person, place, and time. No cranial nerve deficit. Coordination, balance, strength, speech and gait are normal.  Skin: Skin is warm and dry. No rash noted. No erythema.  Psychiatric: Patient has a normal mood and affect. behavior is normal. Judgment and thought content normal.     Assessment & Plan  1. Well adult exam (Primary)  - Lipid panel - CBC with Differential/Platelet - Comprehensive metabolic panel with GFR - RPR - HIV Antibody (routine testing w rflx)  2. Routine screening for STI (sexually transmitted infection)  - Cervicovaginal ancillary only - Urine cytology ancillary only - emtricitabine -tenofovir  (TRUVADA ) 200-300 MG tablet; Take 1 tablet by mouth daily.  Dispense: 90 tablet; Refill: 1 - Cervicovaginal ancillary only  3. High risk bisexual behavior  - Cervicovaginal ancillary only - Urine cytology ancillary only - emtricitabine -tenofovir  (TRUVADA ) 200-300 MG tablet; Take 1 tablet by mouth daily.  Dispense: 90 tablet; Refill: 1 - doxycycline  (VIBRA -TABS) 100 MG tablet; Take 2 tablets (200 mg total) by mouth once as needed for up to 1 dose. And may repeat within 72 hours of intercourse without protection  Dispense: 30 tablet; Refill: 0 - RPR - HIV Antibody (routine testing w rflx) - Cervicovaginal ancillary only  4. Immunization  due  - Flu vaccine trivalent PF, 6mos and older(Flulaval,Afluria,Fluarix,Fluzone)   -Prostate cancer screening and PSA options (with potential risks and benefits of testing vs not testing) were discussed along  with recent recs/guidelines. -USPSTF grade A and B recommendations reviewed with patient; age-appropriate recommendations, preventive care, screening tests, etc discussed and encouraged; healthy living encouraged; see AVS for patient education given to patient -Discussed importance of 150 minutes of physical activity weekly, eat two servings of fish weekly, eat one serving of tree nuts ( cashews, pistachios, pecans, almonds.SABRA) every other day, eat 6 servings of fruit/vegetables daily and drink plenty of water and avoid sweet beverages.  -Reviewed Health Maintenance: yes

## 2024-03-04 LAB — COMPREHENSIVE METABOLIC PANEL WITH GFR
AG Ratio: 1.9 (calc) (ref 1.0–2.5)
ALT: 13 U/L (ref 9–46)
AST: 18 U/L (ref 10–40)
Albumin: 4.8 g/dL (ref 3.6–5.1)
Alkaline phosphatase (APISO): 59 U/L (ref 36–130)
BUN: 9 mg/dL (ref 7–25)
CO2: 28 mmol/L (ref 20–32)
Calcium: 9.4 mg/dL (ref 8.6–10.3)
Chloride: 104 mmol/L (ref 98–110)
Creat: 0.77 mg/dL (ref 0.60–1.24)
Globulin: 2.5 g/dL (ref 1.9–3.7)
Glucose, Bld: 81 mg/dL (ref 65–99)
Potassium: 4.2 mmol/L (ref 3.5–5.3)
Sodium: 140 mmol/L (ref 135–146)
Total Bilirubin: 1.6 mg/dL — ABNORMAL HIGH (ref 0.2–1.2)
Total Protein: 7.3 g/dL (ref 6.1–8.1)
eGFR: 126 mL/min/1.73m2 (ref >=60)

## 2024-03-04 LAB — LIPID PANEL
Cholesterol: 122 mg/dL (ref <200)
HDL: 50 mg/dL (ref >=40)
LDL Cholesterol (Calc): 57 mg/dL
Non-HDL Cholesterol (Calc): 72 mg/dL (ref <130)
Total CHOL/HDL Ratio: 2.4 (calc) (ref <5.0)
Triglycerides: 70 mg/dL (ref <150)

## 2024-03-04 LAB — CBC WITH DIFFERENTIAL/PLATELET
Absolute Lymphocytes: 1390 {cells}/uL (ref 850–3900)
Absolute Monocytes: 430 {cells}/uL (ref 200–950)
Basophils Absolute: 30 {cells}/uL (ref 0–200)
Basophils Relative: 0.6 %
Eosinophils Absolute: 160 {cells}/uL (ref 15–500)
Eosinophils Relative: 3.2 %
HCT: 44.3 % (ref 38.5–50.0)
Hemoglobin: 14.6 g/dL (ref 13.2–17.1)
MCH: 31.2 pg (ref 27.0–33.0)
MCHC: 33 g/dL (ref 32.0–36.0)
MCV: 94.7 fL (ref 80.0–100.0)
MPV: 13.3 fL — ABNORMAL HIGH (ref 7.5–12.5)
Monocytes Relative: 8.6 %
Neutro Abs: 2990 {cells}/uL (ref 1500–7800)
Neutrophils Relative %: 59.8 %
Platelets: 211 Thousand/uL (ref 140–400)
RBC: 4.68 Million/uL (ref 4.20–5.80)
RDW: 12.3 % (ref 11.0–15.0)
Total Lymphocyte: 27.8 %
WBC: 5 Thousand/uL (ref 3.8–10.8)

## 2024-03-04 LAB — HIV ANTIBODY (ROUTINE TESTING W REFLEX)
HIV 1&2 Ab, 4th Generation: NONREACTIVE
HIV FINAL INTERPRETATION: NEGATIVE

## 2024-03-04 LAB — RPR: RPR Ser Ql: NONREACTIVE

## 2024-03-05 LAB — CERVICOVAGINAL ANCILLARY ONLY
Chlamydia: NEGATIVE
Chlamydia: NEGATIVE
Comment: NEGATIVE
Comment: NORMAL
Comment: NEGATIVE
Comment: NORMAL
Neisseria Gonorrhea: NEGATIVE
Neisseria Gonorrhea: NEGATIVE

## 2024-03-05 LAB — URINE CYTOLOGY ANCILLARY ONLY
Chlamydia: NEGATIVE
Comment: NEGATIVE
Comment: NORMAL
Neisseria Gonorrhea: NEGATIVE

## 2024-03-06 ENCOUNTER — Ambulatory Visit: Admitting: Family Medicine

## 2024-03-06 ENCOUNTER — Encounter: Payer: Self-pay | Admitting: Family Medicine

## 2024-03-06 VITALS — BP 112/70 | HR 62 | Resp 16 | Ht 68.0 in | Wt 151.0 lb

## 2024-03-06 DIAGNOSIS — L210 Seborrhea capitis: Secondary | ICD-10-CM | POA: Diagnosis not present

## 2024-03-06 DIAGNOSIS — F339 Major depressive disorder, recurrent, unspecified: Secondary | ICD-10-CM

## 2024-03-06 DIAGNOSIS — L219 Seborrheic dermatitis, unspecified: Secondary | ICD-10-CM

## 2024-03-06 DIAGNOSIS — J4520 Mild intermittent asthma, uncomplicated: Secondary | ICD-10-CM

## 2024-03-06 DIAGNOSIS — F901 Attention-deficit hyperactivity disorder, predominantly hyperactive type: Secondary | ICD-10-CM

## 2024-03-06 MED ORDER — LISDEXAMFETAMINE DIMESYLATE 50 MG PO CAPS
50.0000 mg | ORAL_CAPSULE | Freq: Every day | ORAL | 0 refills | Status: AC
Start: 2024-03-06 — End: ?

## 2024-03-06 MED ORDER — AIRSUPRA 90-80 MCG/ACT IN AERO: 2.0000 | INHALATION_SPRAY | Freq: Four times a day (QID) | RESPIRATORY_TRACT | 0 refills | Status: AC

## 2024-03-06 MED ORDER — BUPROPION HCL ER (XL) 150 MG PO TB24: 150.0000 mg | ORAL_TABLET | Freq: Every day | ORAL | 1 refills | Status: AC

## 2024-03-06 MED ORDER — KETOCONAZOLE 2 % EX SHAM: 1.0000 | MEDICATED_SHAMPOO | CUTANEOUS | 0 refills | Status: AC

## 2024-03-06 NOTE — Progress Notes (Signed)
 Name: Benjamin Pitts   MRN: [id number]    DOB: 1995/11/29   Date:03/06/2024       Progress Note  Subjective  Chief Complaint  Chief Complaint  Patient presents with   Medical Management of Chronic Issues   Discussed the use of AI scribe software for clinical note transcription with the patient, who gave verbal consent to proceed.  History of Present Illness Benjamin Pitts is a 28 year old male who presents for medication refills and follow-up.  He recently underwent a physical examination and lab work, including STI screening for HIV, syphilis, gonorrhea, and chlamydia, all of which were negative. His cholesterol levels were checked, and his LDL was 57. He is not anemic, and his kidney and liver functions are normal. His bilirubin levels have been slightly elevated in past lab work.  He has a long history of ADHD and has been taking Vyvanse , but not on a regular basis. He uses it primarily on workdays and not on days off. He last filled his prescription in January and has requested additional refills. He is not currently attending school, which makes it easier for him to manage without daily medication.  Regarding his major depressive disorder, he is doing okay and has not experienced feelings of being down, depressed, or hopeless. His PHQ-9 score was negative. He has not been taking his depression medication regularly.  He has a history of asthma, which has been stable without recent flare-ups. He experiences coughing, particularly with seasonal changes, and has been using albuterol  as needed. He believes he still has some medication left.  He inquired about diabetes and prostate cancer screening. His blood sugar levels and A1c have been normal, and he has not experienced significant weight changes.    Patient Active Problem List   Diagnosis Date Noted   Asthma, well controlled 10/26/2021   Major depression, recurrent, chronic 06/09/2020   Eczema 02/23/2015   Attention-deficit  hyperactivity disorder, predominantly hyperactive type 02/23/2015   Allergic rhinitis, seasonal 02/23/2015   Asthma, mild intermittent 02/23/2015   Learning problem 02/23/2015    History reviewed. No pertinent surgical history.  Family History  Problem Relation Age of Onset   Asthma Sister    Seizures Sister     Social History   Tobacco Use   Smoking status: Never   Smokeless tobacco: Never  Substance Use Topics   Alcohol use: No    Alcohol/week: 0.0 standard drinks of alcohol     Current Outpatient Medications:    Albuterol -Budesonide (AIRSUPRA ) 90-80 MCG/ACT AERO, Inhale 2 puffs into the lungs 4 (four) times daily., Disp: 10.7 g, Rfl: 0   buPROPion  (WELLBUTRIN  XL) 150 MG 24 hr tablet, Take 1 tablet (150 mg total) by mouth at bedtime., Disp: 90 tablet, Rfl: 1   doxycycline  (VIBRA -TABS) 100 MG tablet, Take 2 tablets (200 mg total) by mouth once as needed for up to 1 dose. And may repeat within 72 hours of intercourse without protection, Disp: 30 tablet, Rfl: 0   emtricitabine -tenofovir  (TRUVADA ) 200-300 MG tablet, Take 1 tablet by mouth daily., Disp: 90 tablet, Rfl: 1   ketoconazole  (NIZORAL ) 2 % shampoo, Apply 1 Application topically 2 (two) times a week., Disp: 120 mL, Rfl: 0   lisdexamfetamine (VYVANSE ) 50 MG capsule, Take 1 capsule (50 mg total) by mouth daily., Disp: 30 capsule, Rfl: 0   loratadine  (CLARITIN ) 10 MG tablet, Take 1 tablet (10 mg total) by mouth daily., Disp: 90 tablet, Rfl: 3   Roflumilast , Antiseborrheic, (ZORYVE ) 0.3 %  FOAM, Apply 1 each topically daily at 12 noon., Disp: 60 g, Rfl: 2  No Known Allergies  I personally reviewed active problem list, medication list, allergies with the patient/caregiver today.   ROS  Ten systems reviewed and is negative except as mentioned in HPI    Objective Physical Exam MEASUREMENTS: Weight- 151. CONSTITUTIONAL: Patient appears well-developed and well-nourished. No distress. HEENT: Head atraumatic, normocephalic,  neck supple. CARDIOVASCULAR: Normal rate, regular rhythm and normal heart sounds. No murmur heard. No BLE edema. PULMONARY: Effort normal and breath sounds normal. No respiratory distress. ABDOMINAL: There is no tenderness or distention. MUSCULOSKELETAL: Normal gait. Without gross motor or sensory deficit. PSYCHIATRIC: Patient has a normal mood and affect. Behavior is normal. Judgment and thought content normal.  Vitals:   03/06/24 0934  BP: 112/70  Pulse: 62  Resp: 16  SpO2: 98%  Weight: 151 lb (68.5 kg)  Height: 5' 8 (1.727 m)    Body mass index is 22.96 kg/m.  Recent Results (from the past 2160 hours)  Cervicovaginal ancillary only     Status: None   Collection Time: 03/03/24  2:29 PM  Result Value Ref Range   Neisseria Gonorrhea Negative    Chlamydia Negative    Comment Normal Reference Ranger Chlamydia - Negative    Comment      Normal Reference Range Neisseria Gonorrhea - Negative  Urine cytology ancillary only     Status: None   Collection Time: 03/03/24  2:29 PM  Result Value Ref Range   Neisseria Gonorrhea Negative    Chlamydia Negative    Comment Normal Reference Ranger Chlamydia - Negative    Comment      Normal Reference Range Neisseria Gonorrhea - Negative  Cervicovaginal ancillary only     Status: None   Collection Time: 03/03/24  2:58 PM  Result Value Ref Range   Neisseria Gonorrhea Negative    Chlamydia Negative    Comment Normal Reference Ranger Chlamydia - Negative    Comment      Normal Reference Range Neisseria Gonorrhea - Negative  Lipid panel     Status: None   Collection Time: 03/03/24  3:32 PM  Result Value Ref Range   Cholesterol 122 <200 mg/dL   HDL 50 > OR = 40 mg/dL   Triglycerides 70 <849 mg/dL   LDL Cholesterol (Calc) 57 mg/dL (calc)    Comment: Reference range: <100 . Desirable range <100 mg/dL for primary prevention;   <70 mg/dL for patients with CHD or diabetic patients  with > or = 2 CHD risk factors. SABRA LDL-C is now  calculated using the Martin-Hopkins  calculation, which is a validated novel method providing  better accuracy than the Friedewald equation in the  estimation of LDL-C.  Gladis APPLETHWAITE et al. SANDREA. 7986;689(80): 2061-2068  (http://education.[facility].com/faq/FAQ164)    Total CHOL/HDL Ratio 2.4 <5.0 (calc)   Non-HDL Cholesterol (Calc) 72 <869 mg/dL (calc)    Comment: For patients with diabetes plus 1 major ASCVD risk  factor, treating to a non-HDL-C goal of <100 mg/dL  (LDL-C of <29 mg/dL) is considered a therapeutic  option.   CBC with Differential/Platelet     Status: Abnormal   Collection Time: 03/03/24  3:32 PM  Result Value Ref Range   WBC 5.0 3.8 - 10.8 Thousand/uL   RBC 4.68 4.20 - 5.80 Million/uL   Hemoglobin 14.6 13.2 - 17.1 g/dL   HCT 55.6 61.4 - 49.9 %   MCV 94.7 80.0 - 100.0 fL   MCH 31.2  27.0 - 33.0 pg   MCHC 33.0 32.0 - 36.0 g/dL    Comment: For adults, a slight decrease in the calculated MCHC value (in the range of 30 to 32 g/dL) is most likely not clinically significant; however, it should be interpreted with caution in correlation with other red cell parameters and the patient's clinical condition.    RDW 12.3 11.0 - 15.0 %   Platelets 211 140 - 400 Thousand/uL   MPV 13.3 (H) 7.5 - 12.5 fL   Neutro Abs 2,990 1,500 - 7,800 cells/uL   Absolute Lymphocytes 1,390 850 - 3,900 cells/uL   Absolute Monocytes 430 200 - 950 cells/uL   Eosinophils Absolute 160 15 - 500 cells/uL   Basophils Absolute 30 0 - 200 cells/uL   Neutrophils Relative % 59.8 %   Total Lymphocyte 27.8 %   Monocytes Relative 8.6 %   Eosinophils Relative 3.2 %   Basophils Relative 0.6 %  Comprehensive metabolic panel with GFR     Status: Abnormal   Collection Time: 03/03/24  3:32 PM  Result Value Ref Range   Glucose, Bld 81 65 - 99 mg/dL    Comment: .            Fasting reference interval .    BUN 9 7 - 25 mg/dL   Creat 9.22 9.39 - 8.75 mg/dL   eGFR 873 > OR = 60 fO/fpw/8.26f7    BUN/Creatinine Ratio SEE NOTE: 6 - 22 (calc)    Comment:    Not Reported: BUN and Creatinine are within    reference range. .    Sodium 140 135 - 146 mmol/L   Potassium 4.2 3.5 - 5.3 mmol/L   Chloride 104 98 - 110 mmol/L   CO2 28 20 - 32 mmol/L   Calcium 9.4 8.6 - 10.3 mg/dL   Total Protein 7.3 6.1 - 8.1 g/dL   Albumin 4.8 3.6 - 5.1 g/dL   Globulin 2.5 1.9 - 3.7 g/dL (calc)   AG Ratio 1.9 1.0 - 2.5 (calc)   Total Bilirubin 1.6 (H) 0.2 - 1.2 mg/dL   Alkaline phosphatase (APISO) 59 36 - 130 U/L   AST 18 10 - 40 U/L   ALT 13 9 - 46 U/L  RPR     Status: None   Collection Time: 03/03/24  3:32 PM  Result Value Ref Range   RPR Ser Ql NON-REACTIVE NON-REACTIVE    Comment: . No laboratory evidence of syphilis. If recent exposure is suspected, submit a new sample in 2-4 weeks. SABRA   HIV Antibody (routine testing w rflx)     Status: None   Collection Time: 03/03/24  3:32 PM  Result Value Ref Range   HIV FINAL INTERPRETATION HIV NEGATIVE     Comment: HIV-1 antigen and HIV-1/HIV-2 antibodies were not detected. There is no laboratory evidence of HIV infection.    HIV 1&2 Ab, 4th Generation NON-REACTIVE NON-REACTIVE     PHQ2/9:    03/06/2024    9:29 AM 03/03/2024    2:24 PM 06/01/2023    1:56 PM 02/27/2023    2:43 PM 10/18/2022   11:40 AM  Depression screen PHQ 2/9  Decreased Interest 0 0 0 0 0  Down, Depressed, Hopeless 0 0 0 0 0  PHQ - 2 Score 0 0 0 0 0  Altered sleeping 0 0 0 1 0  Tired, decreased energy 0 0 0 2 0  Change in appetite 0 0 0 2 0  Feeling bad or failure  about yourself  0 0 0 0 0  Trouble concentrating 0 0 0 1 0  Moving slowly or fidgety/restless 0 0 0 2 0  Suicidal thoughts 0 0 0 0 0  PHQ-9 Score 0 0 0 8 0  Difficult doing work/chores   Not difficult at all Somewhat difficult Not difficult at all    phq 9 is negative  Fall Risk:    03/06/2024    9:29 AM 03/03/2024    2:24 PM 06/01/2023    1:53 PM 02/27/2023    2:47 PM 10/18/2022   11:39 AM  Fall Risk    Falls in the past year? 0 0 0 0 0  Number falls in past yr: 0 0 0  0  Injury with Fall? 0 0 0  0  Risk for fall due to : No Fall Risks No Fall Risks No Fall Risks No Fall Risks No Fall Risks  Follow up Falls evaluation completed Falls prevention discussed Falls prevention discussed;Education provided;Falls evaluation completed Falls prevention discussed;Education provided;Falls evaluation completed Falls prevention discussed;Education provided;Falls evaluation completed      Assessment & Plan Recent labs discussion  Comprehensive evaluation showed negative STI screenings, improved cholesterol with LDL at 57, normal CBC with slightly elevated MCV, normal glucose, kidney, and liver function tests, and 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 - Provided printed information on Gilbert syndrome.  Attention Deficit Hyperactivity Disorder (ADHD) Long-standing ADHD managed with infrequent Vyvanse  use. - Provided one additional prescription for Vyvanse  50 mg. - Instructed to call for another prescription if needed within the next six months.  Major Depressive Disorder, chronic No current depressive symptoms. PHQ-9 negative. Not on regular medication. - Prescribed Wellbutrin  for use during winter months.  Asthma Mild intermittent  Well-managed with no recent flare-ups. Uses albuterol  as needed. - Prescribed AirSupra  inhaler for use as needed. - Instructed on expiration and usage of inhaler.  Seborrheic Dermatitis of Scalp and forehead Managed with ketoconazole  shampoo. - Prescribed ketoconazole  shampoo for continued use.  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 No symptoms or treatment needed.

## 2024-05-05 ENCOUNTER — Other Ambulatory Visit (HOSPITAL_COMMUNITY): Payer: Self-pay

## 2024-05-05 ENCOUNTER — Telehealth: Payer: Self-pay | Admitting: Pharmacy Technician

## 2024-05-05 NOTE — Telephone Encounter (Signed)
 Pharmacy Patient Advocate Encounter   Received notification from CoverMyMeds that prior authorization for Airsupra  90-80MCG/ACT aerosol is due for renewal.   Insurance verification completed.   The patient is insured through [employer]. 12/ Action: PA required and submitted KEY/EOC/Request #: [id number] from 05/05/24 to 05/05/25. Ran test claim, Copay is $4.00. This test claim was processed through [facility]- copay amounts may vary at other pharmacies due to pharmacy/plan contracts, or as the patient moves through the different stages of their insurance plan.

## 2024-09-12 ENCOUNTER — Ambulatory Visit: Admitting: Family Medicine

## 2025-03-06 ENCOUNTER — Encounter: Admitting: Family Medicine
